# Patient Record
Sex: Male | Born: 1935 | Race: White | Hispanic: No | Smoking: Never smoker
Health system: Southern US, Community
[De-identification: ages and names within clinical notes are randomized; demographics above are authoritative.]

## PROBLEM LIST (undated history)

## (undated) DIAGNOSIS — I2699 Other pulmonary embolism without acute cor pulmonale: Secondary | ICD-10-CM

## (undated) DIAGNOSIS — I10 Essential (primary) hypertension: Secondary | ICD-10-CM

## (undated) DIAGNOSIS — E669 Obesity, unspecified: Secondary | ICD-10-CM

## (undated) DIAGNOSIS — I82409 Acute embolism and thrombosis of unspecified deep veins of unspecified lower extremity: Secondary | ICD-10-CM

## (undated) DIAGNOSIS — E785 Hyperlipidemia, unspecified: Secondary | ICD-10-CM

---

## 2004-12-08 ENCOUNTER — Ambulatory Visit: Payer: Self-pay | Admitting: General Surgery

## 2005-02-13 ENCOUNTER — Ambulatory Visit: Payer: Self-pay | Admitting: Internal Medicine

## 2005-11-09 ENCOUNTER — Ambulatory Visit: Payer: Self-pay | Admitting: Internal Medicine

## 2006-10-18 ENCOUNTER — Ambulatory Visit (HOSPITAL_BASED_OUTPATIENT_CLINIC_OR_DEPARTMENT_OTHER): Admission: RE | Admit: 2006-10-18 | Discharge: 2006-10-18 | Payer: Self-pay | Admitting: Urology

## 2009-07-10 ENCOUNTER — Emergency Department (HOSPITAL_COMMUNITY): Admission: EM | Admit: 2009-07-10 | Discharge: 2009-07-10 | Payer: Self-pay | Admitting: Emergency Medicine

## 2011-01-16 NOTE — Op Note (Signed)
Tommy Barton, Tommy Barton               ACCOUNT NO.:  [id number]   MEDICAL RECORD NO.:  [id number]          PATIENT TYPE:  AMB   LOCATION:  [facility]                         FACILITY:  [facility]   PHYSICIAN:  Valetta Fuller, M.D.  DATE OF BIRTH:  [birth year]   DATE OF PROCEDURE:  10/18/2006  DATE OF DISCHARGE:                               OPERATIVE REPORT   PREOPERATIVE DIAGNOSIS:  Left hydrocele.   POSTOPERATIVE DIAGNOSIS:  Left hydrocele.   PROCEDURE PERFORMED:  Scrotal exploration with repair of left hydrocele.   SURGEON:  Valetta Fuller, M.D.   ANESTHESIA:  General.   INDICATIONS:  [name] is a 77 year old male who was self-referred.  He  came in complaining of some left testicular pain and scrotal swelling.  Clinical exam showed a moderate to large tense left hydrocele.  This was  confirmed on ultrasound which showed a large multiseptated left  hydrocele with a normal underlying testis.  We gave the patient an  option of just continuing to observe this, versus going ahead with  surgical repair.  He opted to have surgical treatment of this; and  appeared to understand the advantages, disadvantages, and the  complications.  Full informed consent was obtained.   TECHNIQUE AND FINDINGS:  The patient was brought to the operating room  where he had successful induction of general anesthesia.  He was placed  in the supine position and prepped and draped in the usual manner.  A  median raphe incision was made; and the left hemiscrotal compartment was  entered.  A very large tense hydrocele was noted going all the way up  the cord.  Spermatic cord and tunica vaginalis were dissected free from  the scrotal wall with blunt dissection and the entire hydrocele was then  freed.  The incision was made in the hydrocele; and several hundred  milliliters of straw-colored clear fluid was obtained; again, consistent  with benign hydrocele fluid.  A small portion of redundant sac was  excised.   The  hydrocele sac was then everted in a bottleneck manner and we  utilized some 3-0 and 4-0 Vicryl suture for this.  A Marcaine spermatic  cord block was performed.  The testis was then returned to the  hemiscrotum taking great care not to have twisted the spermatic cord.  The scrotal wall was closed with two layers of Vicryl suture.  A  Tegaderm dressing was applied over the incision.  No obvious  complications occurred; and the patient appeared to do well.           ______________________________  Valetta Fuller, M.D.  Electronically Signed     DSG/MEDQ  D:  10/18/2006  T:  10/18/2006  Job:  [id number]

## 2011-12-31 ENCOUNTER — Ambulatory Visit: Payer: Self-pay | Admitting: Internal Medicine

## 2012-01-17 ENCOUNTER — Encounter (HOSPITAL_COMMUNITY): Payer: Self-pay

## 2012-01-17 ENCOUNTER — Emergency Department (HOSPITAL_COMMUNITY)
Admission: EM | Admit: 2012-01-17 | Discharge: 2012-01-17 | Disposition: A | Discharge status: Home / Self Care | Payer: Medicare Other | Attending: Emergency Medicine | Admitting: Emergency Medicine

## 2012-01-17 DIAGNOSIS — S61409A Unspecified open wound of unspecified hand, initial encounter: Secondary | ICD-10-CM | POA: Insufficient documentation

## 2012-01-17 DIAGNOSIS — Y92009 Unspecified place in unspecified non-institutional (private) residence as the place of occurrence of the external cause: Secondary | ICD-10-CM | POA: Insufficient documentation

## 2012-01-17 DIAGNOSIS — T148XXA Other injury of unspecified body region, initial encounter: Secondary | ICD-10-CM

## 2012-01-17 DIAGNOSIS — W540XXA Bitten by dog, initial encounter: Secondary | ICD-10-CM | POA: Insufficient documentation

## 2012-01-17 MED ORDER — AMOXICILLIN-POT CLAVULANATE 875-125 MG PO TABS: 1.0000 | ORAL_TABLET | Freq: Two times a day (BID) | ORAL | Status: AC

## 2012-01-17 MED ORDER — DIPHTH-ACELL PERTUSSIS-TETANUS 25-58-10 LF-MCG/0.5 IM SUSP
0.5000 mL | Freq: Once | INTRAMUSCULAR | Status: AC
  Administered 2012-01-17: 0.5 mL via INTRAMUSCULAR
  Filled 2012-01-17 (×2): qty 0.5

## 2012-01-17 MED ORDER — HYDROCODONE-ACETAMINOPHEN 7.5-325 MG PO TABS: 1.0000 | ORAL_TABLET | ORAL | Status: AC | PRN

## 2012-01-17 MED ORDER — ONDANSETRON HCL 4 MG PO TABS
4.0000 mg | ORAL_TABLET | Freq: Once | ORAL | Status: AC
  Administered 2012-01-17: 4 mg via ORAL
  Filled 2012-01-17: qty 1

## 2012-01-17 MED ORDER — AMOXICILLIN-POT CLAVULANATE 875-125 MG PO TABS
1.0000 | ORAL_TABLET | Freq: Once | ORAL | Status: AC
  Administered 2012-01-17: 1 via ORAL
  Filled 2012-01-17: qty 1

## 2012-01-17 NOTE — Discharge Instructions (Signed)
Animal Bite PLEASE CLEANSE THE WOUND WITH SOAP AND WATER DAILY. PLEASE HAVE THE WOUND RECHECKED ON WED. MAY 22. AUGMENTIN 2 TIMES DAILY AFTER A MEAL. TYLENOL FOR MILD PAIN. NORCO FOR FOR SEVERE PAIN. THIS MAY CAUSE DROWSINESS,AND CONSTIPATION, USE WITH CAUTION.                             An animal bite can result in a scratch on the skin, deep open cut, puncture of the skin, crush injury, or tearing away of the skin or a body part. Dogs are responsible for most animal bites. Children are bitten more often than adults. An animal bite can range from very mild to more serious. A small bite from your house pet is no cause for alarm. However, some animal bites can become infected or injure a bone or other tissue. You must seek medical care if:  The skin is broken and bleeding does not slow down or stop after 15 minutes.   The puncture is deep and difficult to clean (such as a cat bite).   Pain, warmth, redness, or pus develops around the wound.   The bite is from a stray animal or rodent. There may be a risk of rabies infection.   The bite is from a snake, raccoon, skunk, fox, coyote, or bat. There may be a risk of rabies infection.   The person bitten has a chronic illness such as diabetes, liver disease, or cancer, or the person takes medicine that lowers the immune system.   There is concern about the location and severity of the bite.  It is important to clean and protect an animal bite wound right away to prevent infection. Follow these steps:  Clean the wound with plenty of water and soap.   Apply an antibiotic cream.   Apply gentle pressure over the wound with a clean towel or gauze to slow or stop bleeding.   Elevate the affected area above the heart to help stop any bleeding.   Seek medical care. Getting medical care within 8 hours of the animal bite leads to the best possible outcome.  DIAGNOSIS  Your caregiver will most likely:  Take a detailed history of the animal and the bite  injury.   Perform a wound exam.   Take your medical history.  Blood tests or X-rays may be performed. Sometimes, infected bite wounds are cultured and sent to a lab to identify the infectious bacteria.  TREATMENT  Medical treatment will depend on the location and type of animal bite as well as the patient's medical history. Treatment may include:  Wound care, such as cleaning and flushing the wound with saline solution, bandaging, and elevating the affected area.   Antibiotics.   Tetanus immunization.   Rabies immunization.   Leaving the wound open to heal. This is often done with animal bites, due to the high risk of infection. However, in certain cases, wound closure with stitches, wound adhesive, skin adhesive strips, or staples may be used.  Infected bites that are left untreated may require intravenous (IV) antibiotics and surgical treatment in the hospital. HOME CARE INSTRUCTIONS  Follow your caregiver's instructions for wound care.   Take all medicines as directed.   If your caregiver prescribes antibiotics, take them as directed. Finish them even if you start to feel better.   Follow up with your caregiver for further exams or immunizations as directed.  You may need a tetanus shot  if:  You cannot remember when you had your last tetanus shot.   You have never had a tetanus shot.   The injury broke your skin.  If you get a tetanus shot, your arm may swell, get red, and feel warm to the touch. This is common and not a problem. If you need a tetanus shot and you choose not to have one, there is a rare chance of getting tetanus. Sickness from tetanus can be serious. SEEK MEDICAL CARE IF:  You notice warmth, redness, soreness, swelling, pus discharge, or a bad smell coming from the wound.   You have a red line on the skin coming from the wound.   You have a fever, chills, or a general ill feeling.   You have nausea or vomiting.   You have continued or worsening pain.    You have trouble moving the injured part.   You have other questions or concerns.  MAKE SURE YOU:  Understand these instructions.   Will watch your condition.   Will get help right away if you are not doing well or get worse.  Document Released: 05/05/2011 Document Revised: 08/06/2011 Document Reviewed: 05/05/2011 [facility] Patient Information 2012 [place], [place].

## 2012-01-17 NOTE — ED Provider Notes (Signed)
History     CSN: [id number]  Arrival date & time 01/17/12  1408   First MD Initiated Contact with Patient 01/17/12 1556      Chief Complaint  Patient presents with  . Animal Bite    (Consider location/radiation/quality/duration/timing/severity/associated sxs/prior treatment) Patient is a 78 y.o. male presenting with animal bite. The history is provided by the patient.  Animal Bite  The incident occurred yesterday. The incident occurred at another residence. Head/neck injury location: none. There is an injury to the left upper arm. The pain is moderate. It is unlikely that a foreign body is present. Pertinent negatives include no chest pain, no abdominal pain, no headaches, no inability to bear weight, no neck pain, no pain when bearing weight, no focal weakness, no light-headedness, no loss of consciousness, no seizures, no cough and no difficulty breathing. There have been no prior injuries to these areas. He is right-handed. His tetanus status is out of date. He has been behaving normally. There were no sick contacts. He has received no recent medical care.    History reviewed. No pertinent past medical history.  History reviewed. No pertinent past surgical history.  No family history on file.  History  Substance Use Topics  . Smoking status: Never Smoker   . Smokeless tobacco: Not on file  . Alcohol Use: No      Review of Systems  Constitutional: Negative for activity change.       All ROS Neg except as noted in HPI  HENT: Negative for nosebleeds and neck pain.   Eyes: Negative for photophobia and discharge.  Respiratory: Negative for cough, shortness of breath and wheezing.   Cardiovascular: Negative for chest pain and palpitations.  Gastrointestinal: Negative for abdominal pain and blood in stool.  Genitourinary: Negative for dysuria, frequency and hematuria.  Musculoskeletal: Negative for back pain and arthralgias.  Skin: Negative.   Neurological: Negative for  dizziness, focal weakness, seizures, loss of consciousness, speech difficulty, light-headedness and headaches.  Psychiatric/Behavioral: Negative for hallucinations and confusion.    Allergies  Review of patient's allergies indicates no known allergies.  Home Medications  No current outpatient prescriptions on file.  BP 137/66  Pulse 55  Temp(Src) 97.5 F (36.4 C) (Oral)  Resp 20  Ht 5\' 10"  (1.778 m)  Wt 270 lb (122.471 kg)  BMI 38.74 kg/m2  SpO2 100%  Physical Exam  Nursing note and vitals reviewed. Constitutional: He is oriented to person, place, and time. He appears well-developed and well-nourished.  Non-toxic appearance.  HENT:  Head: Normocephalic.  Right Ear: Tympanic membrane and external ear normal.  Left Ear: Tympanic membrane and external ear normal.  Eyes: EOM and lids are normal. Pupils are equal, round, and reactive to light.  Neck: Normal range of motion. Neck supple. Carotid bruit is not present.  Cardiovascular: Normal rate, regular rhythm, normal heart sounds, intact distal pulses and normal pulses.   Pulmonary/Chest: Breath sounds normal. No respiratory distress.  Abdominal: Soft. Bowel sounds are normal. There is no tenderness. There is no guarding.  Musculoskeletal: Normal range of motion.       FROM of the fingers and wrist of the left hand. 2 puncture wounds to the tricep area. Large bruise to the same area. Soreness of the left shoulder. No dislocation. PUlses symmetrical.   Lymphadenopathy:       Head (right side): No submandibular adenopathy present.       Head (left side): No submandibular adenopathy present.    He has no  cervical adenopathy.  Neurological: He is alert and oriented to person, place, and time. He has normal strength. No cranial nerve deficit or sensory deficit.  Skin: Skin is warm and dry.  Psychiatric: He has a normal mood and affect. His speech is normal.    ED Course  Procedures (including critical care time)  Labs Reviewed -  No data to display No results found. Pulse ox 100% on RA. WNL by my interpretation.  No diagnosis found.    MDM  I have reviewed nursing notes, vital signs, and all appropriate lab and imaging results for this patient. Pt sustained a bite by a large dog on 5/18. He presents today with 2 puncture wounds and a large bruise. Good ROM of all joints. Pt treated with augmentin. Tetanus given. Rx for norco 7.5 #20 given. Pt to follow up with his primary MD or return to the ED in 2 days.       Kathie Dike, [place] 01/17/12 [phone]

## 2012-01-17 NOTE — ED Provider Notes (Signed)
Medical screening examination/treatment/procedure(s) were performed by non-physician practitioner and as supervising physician I was immediately available for consultation/collaboration.  Per patient, animal is UTD on rabies and animal control has been contacted  Joya Gaskins, MD 01/17/12 [phone]

## 2012-01-17 NOTE — ED Notes (Signed)
Pt reports delivered some hay to someone and their dog bit pt's left arm yesterday.   Nursing secretary calling [place]  animal control.  Pt reports last tetanus shot was greater than 5 years ago.  Bruising noted to left posterior upper arm.  2 puncture marks also noted to arm, bleeding controlled with bandaid.

## 2012-01-17 NOTE — ED Notes (Signed)
Pt states was delivering hay to a neighbor when their dog lunged at him biting him in the upper left arm. Bruising, swelling , redness and 3 puncture wounds noted. Bleeding controlled. Wound cleaned. Pt tolerated well.

## 2012-08-09 ENCOUNTER — Emergency Department (HOSPITAL_COMMUNITY)
Admission: EM | Admit: 2012-08-09 | Discharge: 2012-08-09 | Disposition: A | Discharge status: Home / Self Care | Payer: Medicare Other | Attending: Emergency Medicine | Admitting: Emergency Medicine

## 2012-08-09 ENCOUNTER — Emergency Department (HOSPITAL_COMMUNITY): Payer: Medicare Other

## 2012-08-09 ENCOUNTER — Encounter (HOSPITAL_COMMUNITY): Payer: Self-pay | Admitting: Emergency Medicine

## 2012-08-09 DIAGNOSIS — Y9301 Activity, walking, marching and hiking: Secondary | ICD-10-CM | POA: Insufficient documentation

## 2012-08-09 DIAGNOSIS — I10 Essential (primary) hypertension: Secondary | ICD-10-CM | POA: Insufficient documentation

## 2012-08-09 DIAGNOSIS — W010XXA Fall on same level from slipping, tripping and stumbling without subsequent striking against object, initial encounter: Secondary | ICD-10-CM | POA: Insufficient documentation

## 2012-08-09 DIAGNOSIS — W19XXXA Unspecified fall, initial encounter: Secondary | ICD-10-CM

## 2012-08-09 DIAGNOSIS — S7010XA Contusion of unspecified thigh, initial encounter: Secondary | ICD-10-CM | POA: Insufficient documentation

## 2012-08-09 DIAGNOSIS — Y9289 Other specified places as the place of occurrence of the external cause: Secondary | ICD-10-CM | POA: Insufficient documentation

## 2012-08-09 DIAGNOSIS — S7012XA Contusion of left thigh, initial encounter: Secondary | ICD-10-CM

## 2012-08-09 HISTORY — DX: Essential (primary) hypertension: I10

## 2012-08-09 NOTE — ED Provider Notes (Signed)
History     CSN: [id number]  Arrival date & time 08/09/12  1125   First MD Initiated Contact with Patient 08/09/12 1302      Chief Complaint  Patient presents with  . Fall  . Hip Pain    (Consider location/radiation/quality/duration/timing/severity/associated sxs/prior treatment) HPI Sava Olden is a 78 y.o. male fell yesterday on rain soaked clay and fell on left leg -hip doesn't really hurt. Pt has been walking on it and says pain is moderate to severe, feels like a cramp in the back of his leg, persisted all night constantly and has been making him limp. Patient did not hit his head and did not lose consciousness on fall. Patient did not have any dizziness or chest pain or shortness of breath prior to his fall-he said he was just being impatient and slipped on some clay. Aleve helping.    Past Medical History  Diagnosis Date  . Hypertension     History reviewed. No pertinent past surgical history.  History reviewed. No pertinent family history.  History  Substance Use Topics  . Smoking status: Never Smoker   . Smokeless tobacco: Not on file  . Alcohol Use: No      Review of Systems At least 10pt or greater review of systems completed and are negative except where specified in the HPI.  Allergies  Review of patient's allergies indicates no known allergies.  Home Medications  No current outpatient prescriptions on file.  BP 154/70  Pulse 68  Temp 97.2 F (36.2 C)  Resp 18  SpO2 99%  Physical Exam  Nursing notes reviewed.  Electronic medical record reviewed. VITAL SIGNS:   Filed Vitals:   08/09/12 1130  BP: 154/70  Pulse: 68  Temp: 97.2 F (36.2 C)  Resp: 18  SpO2: 99%   CONSTITUTIONAL: Awake, oriented, appears non-toxic HENT: Atraumatic, normocephalic, oral mucosa pink and moist, airway patent. Nares patent without drainage. External ears normal. EYES: Conjunctiva clear, EOMI, PERRLA NECK: Trachea midline, non-tender, supple CARDIOVASCULAR:  Normal heart rate, Normal rhythm, No murmurs, rubs, gallops PULMONARY/CHEST: Clear to auscultation, no rhonchi, wheezes, or rales. Symmetrical breath sounds. Non-tender. ABDOMINAL: Non-distended, soft, obese, non-tender - no rebound or guarding.  BS normal. NEUROLOGIC: Non-focal, moving all four extremities, no gross sensory or motor deficits. EXTREMITIES: No clubbing, cyanosis, or edema.  TTP left thigh and hamstring.  Contusion to distal left hamstring.  SKIN: Warm, Dry, No erythema, No rash. Ecchymoses to left upper arm.  ED Course  Procedures (including critical care time)  Labs Reviewed - No data to display Dg Hip Complete Left  08/09/2012  *RADIOLOGY REPORT*  Clinical Data: Left hip pain post fall yesterday  LEFT HIP - COMPLETE 2+ VIEW  Comparison: None.  Findings: Three views of the left hip submitted.  No acute fracture or subluxation.  Mild degenerative changes bilateral hip joints with mild superior narrowing of the joint space.  Mild spurring of the left greater femoral trochanter.  IMPRESSION: No acute fracture or subluxation.  Mild degenerative changes.   Original Report Authenticated By: Natasha Mead, M.D.      1. Contusion of thigh, left   2. Fall       MDM  Garland Suriano is a 78 y.o. male resenting with contusion of his hamstring suffered after a fall. No fracture seen on pelvic x-ray. I do not think he's got a fracture his femur, he has no pain in his knee I do not think further x-rays are indicated at this time.  Aleve has been working for him so he said to continue that. Also urged him to use either a warm bath or warm compresses or heating pad to help mobilize the fluid and contusion in the back of his thigh.  I explained the diagnosis and have given explicit precautions to return to the ER including any other new or worsening symptoms. The patient understands and accepts the medical plan as it's been dictated and I have answered their questions. Discharge instructions  concerning home care and prescriptions have been given.  The patient is STABLE and is discharged to home in good condition.       Jones Skene, MD 08/09/12 1549

## 2012-08-09 NOTE — ED Notes (Signed)
Pt c/o left upper leg and hip pain that began after he fell yesterday afternoon. Pt states he slipped in mud while walking outside. Pt denies hitting head. Pt has taken OTC pain meds with mild relief.

## 2012-12-07 ENCOUNTER — Ambulatory Visit: Payer: Self-pay | Admitting: Gastroenterology

## 2013-09-08 DIAGNOSIS — Z85828 Personal history of other malignant neoplasm of skin: Secondary | ICD-10-CM | POA: Diagnosis not present

## 2013-09-08 DIAGNOSIS — L570 Actinic keratosis: Secondary | ICD-10-CM | POA: Diagnosis not present

## 2014-02-28 DIAGNOSIS — N529 Male erectile dysfunction, unspecified: Secondary | ICD-10-CM | POA: Diagnosis not present

## 2014-03-08 DIAGNOSIS — L570 Actinic keratosis: Secondary | ICD-10-CM | POA: Diagnosis not present

## 2014-03-08 DIAGNOSIS — Z85828 Personal history of other malignant neoplasm of skin: Secondary | ICD-10-CM | POA: Diagnosis not present

## 2014-03-08 DIAGNOSIS — L7211 Pilar cyst: Secondary | ICD-10-CM | POA: Diagnosis not present

## 2014-03-08 DIAGNOSIS — D0420 Carcinoma in situ of skin of unspecified ear and external auricular canal: Secondary | ICD-10-CM | POA: Diagnosis not present

## 2014-03-08 DIAGNOSIS — D485 Neoplasm of uncertain behavior of skin: Secondary | ICD-10-CM | POA: Diagnosis not present

## 2014-03-22 DIAGNOSIS — I10 Essential (primary) hypertension: Secondary | ICD-10-CM | POA: Diagnosis not present

## 2014-03-22 DIAGNOSIS — E785 Hyperlipidemia, unspecified: Secondary | ICD-10-CM | POA: Diagnosis not present

## 2014-03-22 DIAGNOSIS — D0420 Carcinoma in situ of skin of unspecified ear and external auricular canal: Secondary | ICD-10-CM | POA: Diagnosis not present

## 2014-03-22 DIAGNOSIS — D0439 Carcinoma in situ of skin of other parts of face: Secondary | ICD-10-CM | POA: Diagnosis not present

## 2014-03-22 DIAGNOSIS — D0430 Carcinoma in situ of skin of unspecified part of face: Secondary | ICD-10-CM | POA: Diagnosis not present

## 2014-03-23 DIAGNOSIS — E039 Hypothyroidism, unspecified: Secondary | ICD-10-CM | POA: Diagnosis not present

## 2014-03-23 DIAGNOSIS — E785 Hyperlipidemia, unspecified: Secondary | ICD-10-CM | POA: Diagnosis not present

## 2014-03-23 DIAGNOSIS — I10 Essential (primary) hypertension: Secondary | ICD-10-CM | POA: Diagnosis not present

## 2014-03-28 DIAGNOSIS — D509 Iron deficiency anemia, unspecified: Secondary | ICD-10-CM | POA: Diagnosis not present

## 2014-03-28 DIAGNOSIS — I10 Essential (primary) hypertension: Secondary | ICD-10-CM | POA: Diagnosis not present

## 2014-03-28 DIAGNOSIS — E785 Hyperlipidemia, unspecified: Secondary | ICD-10-CM | POA: Diagnosis not present

## 2014-09-07 DIAGNOSIS — L820 Inflamed seborrheic keratosis: Secondary | ICD-10-CM | POA: Diagnosis not present

## 2014-09-07 DIAGNOSIS — L570 Actinic keratosis: Secondary | ICD-10-CM | POA: Diagnosis not present

## 2014-09-07 DIAGNOSIS — D485 Neoplasm of uncertain behavior of skin: Secondary | ICD-10-CM | POA: Diagnosis not present

## 2014-09-07 DIAGNOSIS — L821 Other seborrheic keratosis: Secondary | ICD-10-CM | POA: Diagnosis not present

## 2014-09-07 DIAGNOSIS — X32XXXA Exposure to sunlight, initial encounter: Secondary | ICD-10-CM | POA: Diagnosis not present

## 2014-09-07 DIAGNOSIS — Z85828 Personal history of other malignant neoplasm of skin: Secondary | ICD-10-CM | POA: Diagnosis not present

## 2014-09-24 DIAGNOSIS — R0789 Other chest pain: Secondary | ICD-10-CM | POA: Diagnosis not present

## 2014-09-24 DIAGNOSIS — M549 Dorsalgia, unspecified: Secondary | ICD-10-CM | POA: Diagnosis not present

## 2014-09-24 DIAGNOSIS — R109 Unspecified abdominal pain: Secondary | ICD-10-CM | POA: Diagnosis not present

## 2015-03-08 DIAGNOSIS — X32XXXA Exposure to sunlight, initial encounter: Secondary | ICD-10-CM | POA: Diagnosis not present

## 2015-03-08 DIAGNOSIS — L821 Other seborrheic keratosis: Secondary | ICD-10-CM | POA: Diagnosis not present

## 2015-03-08 DIAGNOSIS — L570 Actinic keratosis: Secondary | ICD-10-CM | POA: Diagnosis not present

## 2015-03-08 DIAGNOSIS — Z85828 Personal history of other malignant neoplasm of skin: Secondary | ICD-10-CM | POA: Diagnosis not present

## 2015-05-24 ENCOUNTER — Ambulatory Visit
Admission: RE | Admit: 2015-05-24 | Discharge: 2015-05-24 | Disposition: A | Discharge status: Home / Self Care | Payer: Medicare Other | Source: Ambulatory Visit | Attending: Internal Medicine | Admitting: Internal Medicine

## 2015-05-24 ENCOUNTER — Other Ambulatory Visit: Payer: Self-pay | Admitting: Internal Medicine

## 2015-05-24 ENCOUNTER — Ambulatory Visit
Admission: RE | Admit: 2015-05-24 | Discharge: 2015-05-24 | Disposition: A | Discharge status: Home / Self Care | Payer: Medicare Other | Source: Ambulatory Visit | Attending: Cardiology | Admitting: Cardiology

## 2015-05-24 DIAGNOSIS — M542 Cervicalgia: Secondary | ICD-10-CM

## 2015-05-24 DIAGNOSIS — M5032 Other cervical disc degeneration, mid-cervical region: Secondary | ICD-10-CM | POA: Diagnosis not present

## 2015-05-24 DIAGNOSIS — M5412 Radiculopathy, cervical region: Secondary | ICD-10-CM | POA: Diagnosis not present

## 2015-05-24 DIAGNOSIS — M25512 Pain in left shoulder: Secondary | ICD-10-CM | POA: Diagnosis not present

## 2015-05-24 DIAGNOSIS — M4692 Unspecified inflammatory spondylopathy, cervical region: Secondary | ICD-10-CM | POA: Diagnosis not present

## 2015-05-24 DIAGNOSIS — M539 Dorsopathy, unspecified: Secondary | ICD-10-CM | POA: Diagnosis not present

## 2015-05-24 DIAGNOSIS — M79602 Pain in left arm: Secondary | ICD-10-CM | POA: Diagnosis not present

## 2015-05-28 DIAGNOSIS — Z23 Encounter for immunization: Secondary | ICD-10-CM | POA: Diagnosis not present

## 2015-05-31 DIAGNOSIS — I10 Essential (primary) hypertension: Secondary | ICD-10-CM | POA: Diagnosis not present

## 2015-05-31 DIAGNOSIS — R5381 Other malaise: Secondary | ICD-10-CM | POA: Diagnosis not present

## 2015-05-31 DIAGNOSIS — E784 Other hyperlipidemia: Secondary | ICD-10-CM | POA: Diagnosis not present

## 2015-05-31 DIAGNOSIS — Z125 Encounter for screening for malignant neoplasm of prostate: Secondary | ICD-10-CM | POA: Diagnosis not present

## 2015-12-06 DIAGNOSIS — X32XXXA Exposure to sunlight, initial encounter: Secondary | ICD-10-CM | POA: Diagnosis not present

## 2015-12-06 DIAGNOSIS — D0439 Carcinoma in situ of skin of other parts of face: Secondary | ICD-10-CM | POA: Diagnosis not present

## 2015-12-06 DIAGNOSIS — Z85828 Personal history of other malignant neoplasm of skin: Secondary | ICD-10-CM | POA: Diagnosis not present

## 2015-12-06 DIAGNOSIS — Z08 Encounter for follow-up examination after completed treatment for malignant neoplasm: Secondary | ICD-10-CM | POA: Diagnosis not present

## 2015-12-06 DIAGNOSIS — D485 Neoplasm of uncertain behavior of skin: Secondary | ICD-10-CM | POA: Diagnosis not present

## 2015-12-06 DIAGNOSIS — L570 Actinic keratosis: Secondary | ICD-10-CM | POA: Diagnosis not present

## 2016-03-04 DIAGNOSIS — D0439 Carcinoma in situ of skin of other parts of face: Secondary | ICD-10-CM | POA: Diagnosis not present

## 2016-03-04 DIAGNOSIS — Z08 Encounter for follow-up examination after completed treatment for malignant neoplasm: Secondary | ICD-10-CM | POA: Diagnosis not present

## 2016-03-04 DIAGNOSIS — Z85828 Personal history of other malignant neoplasm of skin: Secondary | ICD-10-CM | POA: Diagnosis not present

## 2016-09-10 DIAGNOSIS — Z08 Encounter for follow-up examination after completed treatment for malignant neoplasm: Secondary | ICD-10-CM | POA: Diagnosis not present

## 2016-09-10 DIAGNOSIS — E784 Other hyperlipidemia: Secondary | ICD-10-CM | POA: Diagnosis not present

## 2016-09-10 DIAGNOSIS — Z85828 Personal history of other malignant neoplasm of skin: Secondary | ICD-10-CM | POA: Diagnosis not present

## 2016-09-10 DIAGNOSIS — L820 Inflamed seborrheic keratosis: Secondary | ICD-10-CM | POA: Diagnosis not present

## 2016-09-10 DIAGNOSIS — L821 Other seborrheic keratosis: Secondary | ICD-10-CM | POA: Diagnosis not present

## 2016-09-10 DIAGNOSIS — Z125 Encounter for screening for malignant neoplasm of prostate: Secondary | ICD-10-CM | POA: Diagnosis not present

## 2016-09-10 DIAGNOSIS — E669 Obesity, unspecified: Secondary | ICD-10-CM | POA: Diagnosis not present

## 2016-09-10 DIAGNOSIS — L538 Other specified erythematous conditions: Secondary | ICD-10-CM | POA: Diagnosis not present

## 2016-09-10 DIAGNOSIS — E785 Hyperlipidemia, unspecified: Secondary | ICD-10-CM | POA: Diagnosis not present

## 2016-09-10 DIAGNOSIS — R5381 Other malaise: Secondary | ICD-10-CM | POA: Diagnosis not present

## 2016-09-10 DIAGNOSIS — I10 Essential (primary) hypertension: Secondary | ICD-10-CM | POA: Diagnosis not present

## 2016-09-18 DIAGNOSIS — E785 Hyperlipidemia, unspecified: Secondary | ICD-10-CM | POA: Diagnosis not present

## 2016-09-18 DIAGNOSIS — E669 Obesity, unspecified: Secondary | ICD-10-CM | POA: Diagnosis not present

## 2016-09-18 DIAGNOSIS — I10 Essential (primary) hypertension: Secondary | ICD-10-CM | POA: Diagnosis not present

## 2016-09-18 DIAGNOSIS — E784 Other hyperlipidemia: Secondary | ICD-10-CM | POA: Diagnosis not present

## 2016-12-26 ENCOUNTER — Emergency Department (HOSPITAL_COMMUNITY): Payer: Medicare Other

## 2016-12-26 ENCOUNTER — Encounter (HOSPITAL_COMMUNITY): Payer: Self-pay

## 2016-12-26 ENCOUNTER — Emergency Department (HOSPITAL_COMMUNITY)
Admission: EM | Admit: 2016-12-26 | Discharge: 2016-12-26 | Disposition: A | Discharge status: Home / Self Care | Payer: Medicare Other | Attending: Emergency Medicine | Admitting: Emergency Medicine

## 2016-12-26 DIAGNOSIS — M1711 Unilateral primary osteoarthritis, right knee: Secondary | ICD-10-CM

## 2016-12-26 DIAGNOSIS — I10 Essential (primary) hypertension: Secondary | ICD-10-CM | POA: Insufficient documentation

## 2016-12-26 DIAGNOSIS — M25561 Pain in right knee: Secondary | ICD-10-CM | POA: Diagnosis not present

## 2016-12-26 MED ORDER — DICLOFENAC SODIUM 75 MG PO TBEC: 75.0000 mg | DELAYED_RELEASE_TABLET | Freq: Two times a day (BID) | ORAL | 0 refills | Status: DC | PRN

## 2016-12-26 NOTE — ED Triage Notes (Signed)
Pt reports r knee pain x 1 week.  Denies injury.  Says pain has gotten progressively worse.

## 2016-12-26 NOTE — ED Notes (Signed)
From radiology 

## 2016-12-26 NOTE — ED Provider Notes (Signed)
[facility] DEPT Provider Note   CSN: [id number] Arrival date & time: 12/26/16  1016     History   Chief Complaint Chief Complaint  Patient presents with  . Knee Pain    HPI Tommy Barton is a 83 y.o. male with no significant past medical history an no history of trauma presenting with right knee pain which started one week ago which is new pain, denies any prior problems with the knee.  He has applied a generic muscle rub cream with no significant improvement, took 2 pain tablets this am (not sure if aleve or ibuprofen) with some relief.  His pain is worsened with weight bearing but is constant, keeping him awake last night.  He states his pain is getting worse and he has noticed a popping sensation along his medial anterior knee with certain movements.  Pain radiates into the posterior medial knee and to his right calf.  The history is provided by the patient.    Past Medical History:  Diagnosis Date  . Hypertension     There are no active problems to display for this patient.   History reviewed. No pertinent surgical history.     Home Medications    Prior to Admission medications   Medication Sig Start Date End Date Taking? Authorizing Provider  atenolol (TENORMIN) 50 MG tablet Take 1 tablet by mouth daily. 12/13/16  Yes Historical Provider, MD  enalapril (VASOTEC) 20 MG tablet Take 1 tablet by mouth daily. 12/13/16  Yes Historical Provider, MD  hydrochlorothiazide (HYDRODIURIL) 25 MG tablet Take 1 tablet by mouth daily. 12/13/16  Yes Historical Provider, MD  lovastatin (MEVACOR) 40 MG tablet Take 1 tablet by mouth daily. 12/13/16  Yes Historical Provider, MD  diclofenac (VOLTAREN) 75 MG EC tablet Take 1 tablet (75 mg total) by mouth 2 (two) times daily as needed. 12/26/16   Evalee Jefferson, PA-C    Family History No family history on file.  Social History Social History  Substance Use Topics  . Smoking status: Never Smoker  . Smokeless tobacco: Never Used  .  Alcohol use No     Allergies   Patient has no known allergies.   Review of Systems Review of Systems  Constitutional: Negative for fever.  Musculoskeletal: Positive for arthralgias. Negative for joint swelling and myalgias.  Skin: Negative for color change.  Neurological: Negative for weakness and numbness.     Physical Exam Updated Vital Signs BP (!) 117/55 (BP Location: Left Arm)   Pulse 64   Temp 97.9 F (36.6 C) (Oral)   Resp 20   Ht 5\' 9"  (1.753 m)   Wt 127 kg   SpO2 95%   BMI 41.35 kg/m   Physical Exam  Constitutional: He appears well-developed and well-nourished.  HENT:  Head: Atraumatic.  Neck: Normal range of motion.  Cardiovascular:  Pulses:      Dorsalis pedis pulses are 2+ on the right side, and 2+ on the left side.  Pulses equal bilaterally.  Musculoskeletal: He exhibits tenderness.       Right knee: He exhibits bony tenderness. He exhibits normal range of motion, no swelling, no effusion, no erythema, no LCL laxity and no MCL laxity. Tenderness found. Medial joint line tenderness noted.  ttp right anterior and medial right knee joint.  No patellar tenderness.  No edema or erythema, no evidence bakers cyst when standing.  Calf is soft and nontender to palpation.   Neurological: He is alert. He has normal strength. He displays normal  reflexes. No sensory deficit.  Skin: Skin is warm and dry.  Psychiatric: He has a normal mood and affect.     ED Treatments / Results  Labs (all labs ordered are listed, but only abnormal results are displayed) Labs Reviewed - No data to display  EKG  EKG Interpretation None       Radiology Dg Knee Complete 4 Views Right  Result Date: 12/26/2016 CLINICAL DATA:  Right knee pain for 1 week.  No known injury. EXAM: RIGHT KNEE - COMPLETE 4+ VIEW COMPARISON:  None. FINDINGS: No evidence of fracture, dislocation, or joint effusion. Mild medial femorotibial compartment joint space narrowing. Soft tissues are  unremarkable. IMPRESSION: No acute osseous injury of the right knee. Electronically Signed   By: Kathreen Devoid   On: 12/26/2016 11:18    Procedures Procedures (including critical care time)  Medications Ordered in ED Medications - No data to display   Initial Impression / Assessment and Plan / ED Course  I have reviewed the triage vital signs and the nursing notes.  Pertinent labs & imaging results that were available during my care of the patient were reviewed by me and considered in my medical decision making (see chart for details).     Pt with medial right knee osteoarthitis. Diclofenac, heat tx,  Referral to ortho prn.   Pt seen by Dr. Gilford Raid prior to dc home.   The patient appears reasonably screened and/or stabilized for discharge and I doubt any other medical condition or other [facility] requiring further screening, evaluation, or treatment in the ED at this time prior to discharge.   Final Clinical Impressions(s) / ED Diagnoses   Final diagnoses:  Primary osteoarthritis of right knee    New Prescriptions New Prescriptions   DICLOFENAC (VOLTAREN) 75 MG EC TABLET    Take 1 tablet (75 mg total) by mouth 2 (two) times daily as needed.      Evalee Jefferson, PA-C 12/26/16 [place], MD 12/26/16 1356

## 2016-12-29 DIAGNOSIS — S8981XA Other specified injuries of right lower leg, initial encounter: Secondary | ICD-10-CM | POA: Diagnosis not present

## 2016-12-29 DIAGNOSIS — M1711 Unilateral primary osteoarthritis, right knee: Secondary | ICD-10-CM | POA: Diagnosis not present

## 2016-12-30 ENCOUNTER — Other Ambulatory Visit: Payer: Self-pay | Admitting: Orthopedic Surgery

## 2016-12-30 DIAGNOSIS — M25561 Pain in right knee: Secondary | ICD-10-CM

## 2017-01-13 ENCOUNTER — Ambulatory Visit
Admission: RE | Admit: 2017-01-13 | Discharge: 2017-01-13 | Disposition: A | Discharge status: Home / Self Care | Payer: Medicare Other | Source: Ambulatory Visit | Attending: Orthopedic Surgery | Admitting: Orthopedic Surgery

## 2017-01-13 DIAGNOSIS — M25561 Pain in right knee: Secondary | ICD-10-CM

## 2017-01-19 DIAGNOSIS — M1711 Unilateral primary osteoarthritis, right knee: Secondary | ICD-10-CM | POA: Diagnosis not present

## 2017-01-19 DIAGNOSIS — S83241A Other tear of medial meniscus, current injury, right knee, initial encounter: Secondary | ICD-10-CM | POA: Diagnosis not present

## 2017-03-24 DIAGNOSIS — S83231A Complex tear of medial meniscus, current injury, right knee, initial encounter: Secondary | ICD-10-CM | POA: Diagnosis not present

## 2017-03-24 DIAGNOSIS — M1711 Unilateral primary osteoarthritis, right knee: Secondary | ICD-10-CM | POA: Diagnosis not present

## 2017-03-24 DIAGNOSIS — M6751 Plica syndrome, right knee: Secondary | ICD-10-CM | POA: Diagnosis not present

## 2017-03-24 DIAGNOSIS — M659 Synovitis and tenosynovitis, unspecified: Secondary | ICD-10-CM | POA: Diagnosis not present

## 2017-03-24 DIAGNOSIS — Y999 Unspecified external cause status: Secondary | ICD-10-CM | POA: Diagnosis not present

## 2017-03-24 DIAGNOSIS — G8918 Other acute postprocedural pain: Secondary | ICD-10-CM | POA: Diagnosis not present

## 2017-03-24 DIAGNOSIS — M94261 Chondromalacia, right knee: Secondary | ICD-10-CM | POA: Diagnosis not present

## 2017-03-31 ENCOUNTER — Ambulatory Visit (HOSPITAL_COMMUNITY): Payer: Medicare Other | Attending: Orthopedic Surgery

## 2017-03-31 ENCOUNTER — Encounter (HOSPITAL_COMMUNITY): Payer: Self-pay

## 2017-03-31 DIAGNOSIS — M6281 Muscle weakness (generalized): Secondary | ICD-10-CM | POA: Diagnosis not present

## 2017-03-31 DIAGNOSIS — R29898 Other symptoms and signs involving the musculoskeletal system: Secondary | ICD-10-CM

## 2017-03-31 DIAGNOSIS — M25661 Stiffness of right knee, not elsewhere classified: Secondary | ICD-10-CM

## 2017-03-31 DIAGNOSIS — M25561 Pain in right knee: Secondary | ICD-10-CM | POA: Diagnosis not present

## 2017-03-31 NOTE — Patient Instructions (Signed)
  HEEL SLIDES - LONG SIT WITH TOWEL AND BELT  While in a sitting position, place a small hand towel under your heel. Next, loop a belt, towel or bed sheet around your foot and pull your knee into a bend position as your foot slides towards your buttock. Hold a gentle stretch and then return back to original position.  Perform 1x/day, 2-3 sets of 10 reps holding for 2-3 seconds at the top   HAMSTRING STRETCH WITH TOWEL  While lying down on your back, hook a towel or strap under  your foot and draw up your leg until a stretch is felt under your leg. calf area.  Keep your knee in a straightened position during the stretch.  Perform 1x/day, 3-5 stretches holding for 30-60 seconds

## 2017-03-31 NOTE — Therapy (Addendum)
[place], [place], [address] Phone: [phone]   Fax:  [phone]  Physical Therapy Evaluation  Patient Details  Name: Tommy Barton MRN: [id number] Date of Birth: 1932/02/02 Referring Provider: Vickey Huger, MD  Encounter Date: 03/31/2017    Past Medical History:  Diagnosis Date  . Hypertension     History reviewed. No pertinent surgical history.  There were no vitals filed for this visit.       Subjective Assessment - 03/31/17 0950    Subjective Pt states that his R knee has been hurting him since April 2018. He states that it came on insidiously, without trauma. He had arthorscopic surgery on 03/24/17 to remove his medial meniscus and he states that his pain has improved since the surgery. He states that it will still wake him up at night but as long as he is moving, his pain is pretty well-managed. He states that he can't get his socks on good because he has difficulty bending his knee enough. He denies any popping or clicking since the surgery. He denies any aggravating factors and reports moving relieves his pain. He has increased redness on his shin, which he reports his from accidentally rubbing "chemo cream" on his shin for over a month; he thought he was using a topical pain-relieving cream but he accidentally used old "chemo cream" that he had for skin cancer on his face over a year ago. He is going to see his dermatologist today regarding this.   Pertinent History HTN   How long can you sit comfortably? no issue   How long can you stand comfortably? if he stands for a long time it bothers him but he isn't sure of how long   How long can you walk comfortably? no issues   Patient Stated Goals no pain and work like I normally do   Currently in Pain? No/denies            [facility] PT Assessment - 03/31/17 0001      Assessment   Medical Diagnosis s/p knee arthroscopy   Referring Provider Vickey Huger, MD    Onset Date/Surgical Date 03/24/17   Prior Therapy none for present issue     Precautions   Precautions None     Restrictions   Weight Bearing Restrictions No     Balance Screen   Has the patient fallen in the past 6 months No   Has the patient had a decrease in activity level because of a fear of falling?  No   Is the patient reluctant to leave their home because of a fear of falling?  No     Prior Function   Level of Independence Independent;Independent with basic ADLs   Leisure farming     ROM / Strength   AROM / PROM / Strength AROM;Strength     AROM   Overall AROM Comments L knee AROM WNL   Right Knee Extension 8   Right Knee Flexion 92     Strength   Right Hip Flexion 4+/5   Right Hip Extension 4/5   Right Hip ABduction 4/5   Left Hip Flexion 5/5   Left Hip Extension 3+/5   Left Hip ABduction 4/5   Right Knee Flexion 5/5   Right Knee Extension 4+/5   Left Knee Flexion 5/5   Left Knee Extension 5/5   Right Ankle Dorsiflexion 5/5   Left Ankle Dorsiflexion 5/5     Flexibility  Soft Tissue Assessment /Muscle Length yes   Hamstrings (90/90): R: 123 deg, L: 127 deg   Quadriceps +Ely's BLE     Palpation   Patella mobility WNL   Palpation comment min to mod soft tissue restrictions of quads, HS, and ITB; tenderness to palpation throughout     Ambulation/Gait   Ambulation Distance (Feet) 658 Feet  3MWT   Assistive device None   Gait Pattern Within Functional Limits   Gait Comments pt had bil toe out and trendelenberg throughout gait but otherwise [facility]     Balance   Balance Assessed Yes     Static Standing Balance   Static Standing - Balance Support No upper extremity supported   Static Standing Balance -  Activities  Single Leg Stance - Right Leg;Single Leg Stance - Left Leg   Static Standing - Comment/# of Minutes R: 4 sec or < L:1 sec or <     Standardized Balance Assessment   Standardized Balance Assessment Five Times Sit to Stand   Five times sit  to stand comments  12 sec from chair with no UE; increased weight shift over LLE          Objective measurements completed on examination: See above findings.          [facility] Adult PT Treatment/Exercise - 03/31/17 0001      Exercises   Exercises Knee/Hip     Knee/Hip Exercises: Stretches   Active Hamstring Stretch Right;2 reps;30 seconds   Active Hamstring Stretch Limitations supine with sheet     Knee/Hip Exercises: Supine   Heel Slides Right;10 reps   Heel Slides Limitations HEP demo                PT Short Term Goals - 03/31/17 1023      PT SHORT TERM GOAL #1   Title Pt will be independent with HEP and perform consistently in order to promote return to PLOF and decrease risk of reinjury.   Time 2   Period Weeks   Status New   Target Date 04/14/17     PT SHORT TERM GOAL #2   Title Pt will have improved AROM from 5-100 to maximize gait and stair ambulation.   Time 2   Period Weeks   Status New     PT SHORT TERM GOAL #3   Title Pt will have improved 5xSTS to 10 sec or < with no UE and proper mechanics to demonstrate improved overall functional strength.   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   Time 4   Period Weeks  Status New                Plan - 03/31/17 1030    Clinical Impression Statement Pt is pleasant 83 YO M who presents to OPPT  s/p R knee arthroscopy for medial menisectomy on 03/24/17. Pt has deficits in AROM, MMT, flexibility, balance, functional strength, stair ambulation, and mild deficits in gait. He also has deficits in flexibility and was noted to have poor quad contraction on the R. Pt also noted to have skin irritation on R anterior shin due to him accidentally applying "chemo cream" on it instead of pain-relieving cream; he states he is going to see his dermatologist about it today but will continue to monitor in therapy sessions. Pt needs skilled PT intervention to address these deficits in order to maximize his overall function.   History and Personal Factors relevant to plan of care: HTN, motivated, active lifestyle   Clinical Presentation Stable   Clinical Presentation due to: AROM, MMT, balance, stairs, functional strength, gait   Clinical Decision Making Low   Rehab Potential Good   PT Frequency 2x / week   PT Duration 4 weeks   PT Treatment/Interventions ADLs/Self Care Home Management;Cryotherapy;Electrical Stimulation;Moist Heat;Gait training;Stair training;Functional mobility training;Therapeutic activities;Therapeutic exercise;Balance training;Neuromuscular re-education;Patient/family education;Manual techniques;Scar mobilization;Passive range of motion;Dry needling;Energy conservation;Taping   PT Next Visit Plan reivew goals and HEP, measure circumferential edema at joint line; prone quad stretch, calf stretching, quad sets, SAQ, LAQ, bridging, sit <> stands, and other functional strengthening; manual as needed for edema and soft tissue restrictions   PT Home Exercise Plan eval: HS stretch, heel slides   Consulted and Agree with Plan of Care Patient      Patient will benefit from skilled therapeutic intervention in order to improve the following deficits and impairments:  Decreased balance, Abnormal gait, Decreased range of motion, Decreased strength, Increased muscle spasms, Impaired flexibility, Obesity,  Improper body mechanics  Visit Diagnosis: Stiffness of right knee, not elsewhere classified - Plan: PT plan of care cert/re-cert  Acute pain of right knee - Plan: PT plan of care cert/re-cert  Muscle weakness (generalized) - Plan: PT plan of care cert/re-cert  Other symptoms and signs involving the musculoskeletal system - Plan: PT plan of care cert/re-cert      G-Codes - 35/45/62 1033    Functional Assessment Tool Used (Outpatient Only) clinical judgement, 5xSTS, SLS, stair ambulation, MMT   Functional Limitation Mobility: Walking and moving around   Mobility: Walking and Moving Around Current Status (B6389) At least 20 percent but less than 40 percent impaired, limited or restricted   Mobility: Walking and Moving Around Goal Status (H7342) At least 1 percent but less than 20 percent impaired, limited or restricted       Problem List There are no active problems to display for this patient.    Geraldine Solar PT, [place] [address] [place], [place], [address] Phone: [phone]   Fax:  [phone]  Name: Tommy Barton MRN: [id number] Date of Birth: 1932-04-11

## 2017-04-05 ENCOUNTER — Ambulatory Visit (HOSPITAL_COMMUNITY): Payer: Medicare Other | Admitting: Physical Therapy

## 2017-04-05 DIAGNOSIS — R29898 Other symptoms and signs involving the musculoskeletal system: Secondary | ICD-10-CM

## 2017-04-05 DIAGNOSIS — M6281 Muscle weakness (generalized): Secondary | ICD-10-CM

## 2017-04-05 DIAGNOSIS — M25661 Stiffness of right knee, not elsewhere classified: Secondary | ICD-10-CM | POA: Diagnosis not present

## 2017-04-05 DIAGNOSIS — M25561 Pain in right knee: Secondary | ICD-10-CM | POA: Diagnosis not present

## 2017-04-05 NOTE — Therapy (Signed)
[place], [place], [address] Phone: [phone]   Fax:  [phone]  Physical Therapy Treatment  Patient Details  Name: Tommy Barton MRN: [id number] Date of Birth: 10-20-1931 Referring Provider: Vickey Huger, MD  Encounter Date: 04/05/2017    Past Medical History:  Diagnosis Date  . Hypertension     No past surgical history on file.  There were no vitals filed for this visit.      Subjective Assessment - 04/05/17 1427    Subjective Pt states he is doing well today . Reports complaince with HEP.  Still with visible redness from "chemo cream" that he thought was pain cream.  No signs/symptoms of infection.   Currently in Pain? No/denies            [facility] PT Assessment - 04/05/17 0001      Assessment   Medical Diagnosis s/p knee arthroscopy     Functional Tests   Functional tests Other     Other:   Other/ Comments circumferential measurements:  midpatella Rt:51cm Lt: 50cm, suprapatella R: 54cm, Lt 52cm     AROM   Right Knee Extension 5   Right Knee Flexion 108                     [facility] Adult PT Treatment/Exercise - 04/05/17 0001      Knee/Hip Exercises: Stretches   Active Hamstring Stretch Right;30 seconds;4 reps   Active Hamstring Stretch Limitations standing 12" box and reviewed supine with sheet   Knee: Self-Stretch to increase Flexion Right;10 seconds   Knee: Self-Stretch Limitations 10 reps   Gastroc Stretch Both;3 reps;20 seconds   Gastroc Stretch Limitations using slant board     Knee/Hip Exercises: Supine   Heel Slides Right;10 reps   Heel Slides Limitations HEP review   Bridges Limitations 10 reps   Straight Leg Raises Right;10 reps   Knee Extension Limitations 5   Knee Flexion Limitations 108     Manual Therapy   Manual Therapy Soft tissue mobilization   Manual therapy comments completed seperately from all other skilled interventions   Soft tissue mobilization  to Rt knee with elevation to decrease edema and encourage reduction in adhesions                PT Education - 04/05/17 1425    Education provided Yes   Education Details reviewed HEP and given copy of eval findings.  Reviewed goals.  Educated to stretch when he has cramps   Person(s) Educated Patient   Methods Explanation;Demonstration;Tactile cues;Verbal cues;Handout   Comprehension Verbalized understanding;Returned demonstration;Verbal cues required;Tactile cues required          PT Short Term Goals - 03/31/17 1023      PT SHORT TERM GOAL #1   Title Pt will be independent with HEP and perform consistently in order to promote return to PLOF and decrease risk of reinjury.   Time 2   Period Weeks   Status New   Target Date 04/14/17     PT SHORT TERM GOAL #2   Title Pt will have improved AROM from 5-100 to maximize gait and stair ambulation.   Time 2   Period Weeks   Status New     PT SHORT TERM GOAL #3   Title Pt will have improved 5xSTS to 10 sec or < with no UE and proper mechanics to demonstrate improved overall functional strength.   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   Time 4   Period Weeks   Status New               Plan - 04/05/17 1410    Clinical Impression Statement reviewed HEP and goals per intial evaluation.  Measured circumferential  edema with 1cm difference mid patella and 2 cm difference superior patella.  Completed gentle sofft tissue to promote removal of edema as well as myofascial to surrwounding tissue to decrease scar.  Pt tolerated well with increae in ROM this session to 5-105 (was 8-92 last session).  Pt with episode of upper hamstring cramping when initiated bridge but was able to resolve with stretching.  completed remaineder of reps without incident.  Instructed with standing hamstring, gastroc and knee flexion stretches with good results.  Encouraged continuation of HEP.   Rehab Potential Good   PT Frequency 2x / week   PT Duration 4 weeks   PT Treatment/Interventions ADLs/Self Care Home Management;Cryotherapy;Electrical Stimulation;Moist Heat;Gait training;Stair training;Functional mobility training;Therapeutic activities;Therapeutic exercise;Balance training;Neuromuscular re-education;Patient/family education;Manual techniques;Scar mobilization;Passive range of motion;Dry needling;Energy conservation;Taping   PT Next Visit Plan Next session add prone quad stretch.  Give written isntructions for calf stretching and standing hamstring stretch. Add sit <> stands and other functional strengthening; manual as needed for edema and soft tissue restrictions   PT Home Exercise Plan eval: HS stretch, heel slides   Consulted and Agree with Plan of Care Patient      Patient will benefit from skilled therapeutic intervention in order to improve the following deficits and impairments:  Decreased balance, Abnormal gait, Decreased range of motion, Decreased strength, Increased muscle spasms, Impaired flexibility, Obesity, Improper body mechanics  Visit Diagnosis: Stiffness of right knee, not elsewhere classified  Acute pain of right knee  Muscle weakness (generalized)  Other symptoms and signs involving the musculoskeletal system     Problem List There are no active problems to display for this patient.  Teena Irani, PTA/CLT [phone]  Teena Irani 04/05/2017, 2:40 PM  [place], [place], [address] Phone: [phone]   Fax:  [phone]  Name: Tommy Barton MRN: [id number] Date of Birth: 11-24-1931

## 2017-04-07 ENCOUNTER — Telehealth (HOSPITAL_COMMUNITY): Payer: Self-pay | Admitting: Physical Therapy

## 2017-04-07 NOTE — Telephone Encounter (Signed)
He is going out of town for a few days and will return to rehab on 04-13-17.

## 2017-04-08 ENCOUNTER — Ambulatory Visit (HOSPITAL_COMMUNITY): Payer: Medicare Other | Admitting: Physical Therapy

## 2017-04-13 ENCOUNTER — Ambulatory Visit (HOSPITAL_COMMUNITY): Payer: Medicare Other | Admitting: Physical Therapy

## 2017-04-13 DIAGNOSIS — M25661 Stiffness of right knee, not elsewhere classified: Secondary | ICD-10-CM | POA: Diagnosis not present

## 2017-04-13 DIAGNOSIS — R29898 Other symptoms and signs involving the musculoskeletal system: Secondary | ICD-10-CM | POA: Diagnosis not present

## 2017-04-13 DIAGNOSIS — M25561 Pain in right knee: Secondary | ICD-10-CM | POA: Diagnosis not present

## 2017-04-13 DIAGNOSIS — M6281 Muscle weakness (generalized): Secondary | ICD-10-CM | POA: Diagnosis not present

## 2017-04-13 NOTE — Patient Instructions (Signed)
Calf Stretch    Stand with hands supported on wall, elbows slightly bent, front knee bent, back knee straight, feet parallel and both heels on floor. Lean into wall by pushing hips forward until a stretch is felt in calf muscle. Hold __30__ seconds. Repeat with leg positions switched.  Hamstring Stretch (Standing)    Standing, place one heel on chair or bench. Use one or both hands on thigh for support. Keeping torso straight, lean forward slowly until a stretch is felt in back of same thigh. Hold _30___ seconds. Repeat with other leg.

## 2017-04-13 NOTE — Therapy (Signed)
[place], [place], [address] Phone: [phone]   Fax:  [phone]  Physical Therapy Treatment  Patient Details  Name: Tommy Barton MRN: [id number] Date of Birth: 1932/08/06 Referring Provider: Vickey Huger, MD  Encounter Date: 04/13/2017      PT End of Session - 04/13/17 0857    Visit Number 3   Number of Visits 12   Date for PT Re-Evaluation 04/28/17   Authorization Type medicare   Authorization Time Period cert 9/5-1/88   Authorization - Visit Number 3   Authorization - Number of Visits 10   PT Start Time 0816   PT Stop Time 0858   PT Time Calculation (min) 42 min   Activity Tolerance Patient tolerated treatment well   Behavior During Therapy [facility] for tasks assessed/performed      Past Medical History:  Diagnosis Date  . Hypertension     No past surgical history on file.  There were no vitals filed for this visit.      Subjective Assessment - 04/13/17 0820    Subjective Pt states he went to the beach last week.  States he came back Friday and has been hurting worse on Saturday and Sunday since he's been back.  Currently 5/10   Currently in Pain? Yes   Pain Score 5    Pain Location Knee   Pain Orientation Right   Pain Descriptors / Indicators Aching                         [facility] Adult PT Treatment/Exercise - 04/13/17 0001      Knee/Hip Exercises: Stretches   Active Hamstring Stretch Right;2 reps;30 seconds   Active Hamstring Stretch Limitations standing 12" box and reviewed supine with sheet   Knee: Self-Stretch to increase Flexion Right;10 seconds   Knee: Self-Stretch Limitations 10 reps   Gastroc Stretch Both;2 reps;30 seconds   Gastroc Stretch Limitations using slant board     Knee/Hip Exercises: Standing   Heel Raises Both;10 reps   Heel Raises Limitations toeraises 10 reps     Knee/Hip Exercises: Seated   Sit to Sand 10 reps;without UE support     Knee/Hip  Exercises: Supine   Short Arc Quad Sets Both;10 reps   Bridges Limitations 10 reps   Straight Leg Raises Right;10 reps   Knee Extension Limitations 3   Knee Flexion Limitations 112                  PT Short Term Goals - 03/31/17 1023      PT SHORT TERM GOAL #1   Title Pt will be independent with HEP and perform consistently in order to promote return to PLOF and decrease risk of reinjury.   Time 2   Period Weeks   Status New   Target Date 04/14/17     PT SHORT TERM GOAL #2   Title Pt will have improved AROM from 5-100 to maximize gait and stair ambulation.   Time 2   Period Weeks   Status New     PT SHORT TERM GOAL #3   Title Pt will have improved 5xSTS to 10 sec or < with no UE and proper mechanics to demonstrate improved overall functional strength.   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   Time 4   Period Weeks   Status New               Plan - 04/13/17 7124    Clinical Impression Statement Continued with established POC.  Reviewed stretches added last session and given written instructions for HEP.  Noted improvement in ROM (3-112, was 5-105 last session) and some reduction in swelling.  Continued with retro massage to address swelling.  Noted scaring at surgical sites with deep scar massage to these areas to reduce.   Added sit to stand and attempted  quad stretch, however prefers not to assume prone positioning.     Rehab Potential Good   PT Frequency 2x / week   PT Duration 4 weeks   PT Treatment/Interventions ADLs/Self Care Home Management;Cryotherapy;Electrical Stimulation;Moist Heat;Gait training;Stair training;Functional mobility training;Therapeutic activities;Therapeutic exercise;Balance training;Neuromuscular re-education;Patient/family education;Manual techniques;Scar mobilization;Passive range of motion;Dry needling;Energy conservation;Taping   PT Next Visit Plan Progress functional strengthening; manual as needed for edema and soft tissue restrictions   PT Home Exercise Plan eval: HS stretch, heel slides; 8l/14: standing hamstring stretch and gastroc stretch   Consulted and Agree with Plan of Care Patient      Patient will benefit from skilled therapeutic intervention in order to improve the following deficits and impairments:  Decreased balance, Abnormal gait, Decreased range of motion, Decreased strength, Increased muscle spasms, Impaired flexibility, Obesity, Improper body mechanics  Visit Diagnosis: Stiffness of right knee, not elsewhere classified  Acute pain of right knee  Muscle weakness (generalized)  Other symptoms and signs involving the musculoskeletal system     Problem List There are no active problems to display for this patient.  Teena Irani, PTA/CLT [phone]  Teena Irani 04/13/2017, 9:02 AM  [place], [place], [address] Phone: [phone]   Fax:  [phone]  Name: Tommy Barton MRN: [id number] Date of Birth: 06/08/1932

## 2017-04-15 ENCOUNTER — Ambulatory Visit (HOSPITAL_COMMUNITY): Payer: Medicare Other | Admitting: Physical Therapy

## 2017-04-15 DIAGNOSIS — R29898 Other symptoms and signs involving the musculoskeletal system: Secondary | ICD-10-CM

## 2017-04-15 DIAGNOSIS — M25661 Stiffness of right knee, not elsewhere classified: Secondary | ICD-10-CM

## 2017-04-15 DIAGNOSIS — M6281 Muscle weakness (generalized): Secondary | ICD-10-CM

## 2017-04-15 DIAGNOSIS — M25561 Pain in right knee: Secondary | ICD-10-CM

## 2017-04-15 NOTE — Therapy (Signed)
[place], [place], [address] Phone: [phone]   Fax:  [phone]  Physical Therapy Treatment  Patient Details  Name: Tommy Barton MRN: [id number] Date of Birth: September 03, 1931 Referring Provider: Vickey Huger, MD  Encounter Date: 04/15/2017      PT End of Session - 04/15/17 0954    Visit Number 4   Number of Visits 12   Date for PT Re-Evaluation 04/28/17   Authorization Type medicare   Authorization Time Period cert 8/5-4/62   Authorization - Visit Number 4   Authorization - Number of Visits 10   PT Start Time 0901   PT Stop Time 0950   PT Time Calculation (min) 49 min   Activity Tolerance Patient tolerated treatment well   Behavior During Therapy [facility] for tasks assessed/performed      Past Medical History:  Diagnosis Date  . Hypertension     No past surgical history on file.  There were no vitals filed for this visit.      Subjective Assessment - 04/15/17 0905    Subjective Pt reports achiness presents, especially at night.  reports he has to "watch my walking when I first get up" but then gets better to the point that he has no pain until he gets still again.    Currently in Pain? Yes   Pain Score 5    Pain Location Knee   Pain Orientation Right   Pain Descriptors / Indicators Aching   Pain Type Chronic pain                         [facility] Adult PT Treatment/Exercise - 04/15/17 0001      Knee/Hip Exercises: Stretches   Knee: Self-Stretch to increase Flexion Right;10 seconds   Knee: Self-Stretch Limitations 10 reps     Knee/Hip Exercises: Standing   Heel Raises Both;15 reps   Heel Raises Limitations toeraises 15 reps   Forward Lunges Both;10 reps   Forward Lunges Limitations onto 4" step no UE's   Lateral Step Up Right;10 reps;Hand Hold: 1;Step Height: 4"   Lateral Step Up Limitations 4" 1 UE assist   Forward Step Up Right;10 reps;Step Height: 4";Hand Hold: 1   Forward  Step Up Limitations 4' 1 UE assist     Knee/Hip Exercises: Seated   Sit to Sand 10 reps;without UE support     Knee/Hip Exercises: Supine   Short Arc Quad Sets Both;15 reps   Bridges Limitations 15 reps   Straight Leg Raises Right;15 reps   Knee Extension Limitations 3   Knee Flexion Limitations 118     Manual Therapy   Manual Therapy Soft tissue mobilization;Edema management   Manual therapy comments completed seperately from all other skilled interventions   Soft tissue mobilization to Rt knee with elevation to decrease edema and encourage reduction in adhesions                  PT Short Term Goals - 03/31/17 1023      PT SHORT TERM GOAL #1   Title Pt will be independent with HEP and perform consistently in order to promote return to PLOF and decrease risk of reinjury.   Time 2   Period Weeks   Status New   Target Date 04/14/17     PT SHORT TERM GOAL #2   Title Pt will have improved AROM from 5-100 to maximize gait and stair ambulation.  Time 2   Period Weeks   Status New     PT SHORT TERM GOAL #3   Title Pt will have improved 5xSTS to 10 sec or < with no UE and proper mechanics to demonstrate improved overall functional strength.   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   Time 4   Period Weeks   Status New               Plan - 04/15/17 1220     Clinical Impression Statement Began step ups this session to increase LE strength and stability.  Required cues for form and completing in slow controlled movements.  Pt also with cues to turn feet into neutral with exericses due to tendency to keep externally rotated.   Continued manual due to persistent swelling perimeter of knee.  ROM today Rt knee 3-118 degrees.  Continues to improve.   Rehab Potential Good   PT Frequency 2x / week   PT Duration 4 weeks   PT Treatment/Interventions ADLs/Self Care Home Management;Cryotherapy;Electrical Stimulation;Moist Heat;Gait training;Stair training;Functional mobility training;Therapeutic activities;Therapeutic exercise;Balance training;Neuromuscular re-education;Patient/family education;Manual techniques;Scar mobilization;Passive range of motion;Dry needling;Energy conservation;Taping   PT Next Visit Plan Progress functional strengthening; manual as needed for edema and soft tissue restrictions.  increase step height as able to simulate tractor.   PT Home Exercise Plan eval: HS stretch, heel slides; 8l/14: standing hamstring stretch and gastroc stretch   Consulted and Agree with Plan of Care Patient      Patient will benefit from skilled therapeutic intervention in order to improve the following deficits and impairments:  Decreased balance, Abnormal gait, Decreased range of motion, Decreased strength, Increased muscle spasms, Impaired flexibility, Obesity, Improper body mechanics  Visit Diagnosis: Stiffness of right knee, not elsewhere classified  Acute pain of right knee  Muscle weakness (generalized)  Other symptoms and signs involving the musculoskeletal system     Problem List There are no active problems to display for this patient.  Teena Irani, PTA/CLT [phone]  Teena Irani 04/15/2017, 12:24 PM  [place] [address] [place], [place], [address] Phone: [phone]   Fax:   [phone]  Name: Tommy Barton MRN: [id number] Date of Birth: April 26, 1932

## 2017-04-19 ENCOUNTER — Ambulatory Visit (HOSPITAL_COMMUNITY): Payer: Medicare Other

## 2017-04-19 ENCOUNTER — Encounter (HOSPITAL_COMMUNITY): Payer: Self-pay

## 2017-04-19 DIAGNOSIS — M6281 Muscle weakness (generalized): Secondary | ICD-10-CM | POA: Diagnosis not present

## 2017-04-19 DIAGNOSIS — M25561 Pain in right knee: Secondary | ICD-10-CM

## 2017-04-19 DIAGNOSIS — R29898 Other symptoms and signs involving the musculoskeletal system: Secondary | ICD-10-CM | POA: Diagnosis not present

## 2017-04-19 DIAGNOSIS — M25661 Stiffness of right knee, not elsewhere classified: Secondary | ICD-10-CM | POA: Diagnosis not present

## 2017-04-19 NOTE — Therapy (Signed)
[place], [place], [address] Phone: [phone]   Fax:  [phone]  Physical Therapy Treatment  Patient Details  Name: Tommy Barton MRN: [id number] Date of Birth: [birth year] Referring Provider: Vickey Huger, MD  Encounter Date: 04/19/2017      PT End of Session - 04/19/17 0816    Visit Number 5   Number of Visits 12   Date for PT Re-Evaluation 04/28/17   Authorization Type medicare   Authorization Time Period cert 0/2-7/74   Authorization - Visit Number 5   Authorization - Number of Visits 10   PT Start Time 0815   PT Stop Time 0856   PT Time Calculation (min) 41 min   Activity Tolerance Patient tolerated treatment well   Behavior During Therapy [facility] for tasks assessed/performed      Past Medical History:  Diagnosis Date  . Hypertension     History reviewed. No pertinent surgical history.  There were no vitals filed for this visit.      Subjective Assessment - 04/19/17 0817    Subjective Pt states that he is pretty good but his knee hurts worse every morning. He states that his whole leg hurts him.   Currently in Pain? Yes   Pain Score 3    Pain Location Knee   Pain Orientation Right   Pain Descriptors / Indicators Aching   Pain Type Chronic pain   Pain Onset 1 to 4 weeks ago   Pain Frequency Intermittent   Aggravating Factors  unsure   Pain Relieving Factors moving around   Effect of Pain on Daily Activities increases             [facility] Adult PT Treatment/Exercise - 04/19/17 0001      Knee/Hip Exercises: Stretches   Active Hamstring Stretch Right;3 reps;30 seconds   Active Hamstring Stretch Limitations supine with rope   Quad Stretch Right;3 reps;30 seconds   Quad Stretch Limitations prone with rope     Knee/Hip Exercises: Standing   Terminal Knee Extension Limitations 10x5" holds with GTB   Lateral Step Up Right;2 sets;10 reps;Hand Hold: 0;Step Height: 4"   Forward Step Up  Right;2 sets;10 reps;Hand Hold: 0;Step Height: 8"   Step Down Right;2 sets;10 reps;Hand Hold: 0;Step Height: 6"   Step Down Limitations cues for decreased foot ER   SLS with Vectors BLE, 5RTx3" holds each   Other Standing Knee Exercises sidestepping 81ftx3RT with GTB   Other Standing Knee Exercises heel taps on 4" step x15 reps     Knee/Hip Exercises: Supine   Bridges Limitations 15 reps  GTB, 2-3 sec hold   Knee Extension Limitations 0   Knee Flexion Limitations 120              PT Education - 04/19/17 0856    Education provided Yes   Education Details updated HEP, exercise technique   Person(s) Educated Patient   Methods Explanation;Demonstration;Handout   Comprehension Verbalized understanding;Returned demonstration          PT Short Term Goals - 03/31/17 1023      PT SHORT TERM GOAL #1   Title Pt will be independent with HEP and perform consistently in order to promote return to PLOF and decrease risk of reinjury.   Time 2   Period Weeks   Status New   Target Date 04/14/17     PT SHORT TERM GOAL #2   Title Pt will have improved AROM from 5-100  to maximize gait and stair ambulation.   Time 2   Period Weeks   Status New     PT SHORT TERM GOAL #3   Title Pt will have improved 5xSTS to 10 sec or < with no UE and proper mechanics to demonstrate improved overall functional strength.   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   Time 4   Period Weeks   Status New               Plan - 04/19/17 0857    Clinical Impression Statement Session focused on RLE strengthening and balance. Pt tolerated therex well with only mild discomfort noted during fwd step downs. 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 Updated pt's HEP to include hip strengthening and he verbalized understanding. Pt's AROM 0-120 this date. Continue POC as planned.   Rehab Potential Good   PT Frequency 2x / week   PT Duration 4 weeks   PT Treatment/Interventions ADLs/Self Care Home Management;Cryotherapy;Electrical Stimulation;Moist Heat;Gait training;Stair training;Functional mobility training;Therapeutic activities;Therapeutic exercise;Balance training;Neuromuscular re-education;Patient/family education;Manual techniques;Scar mobilization;Passive range of motion;Dry needling;Energy conservation;Taping   PT Next Visit Plan continue to progress functional strengthening; manual as needed for edema and soft tissue restrictions.   PT Home Exercise Plan eval: HS stretch, heel slides; 8l/14: standing hamstring stretch and gastroc stretch; 8/20: bridging with band, step ups, sidestepping with band   Consulted and Agree with Plan of Care Patient      Patient will benefit from skilled therapeutic intervention in order to improve the following deficits and impairments:  Decreased balance, Abnormal gait, Decreased range of motion, Decreased strength, Increased muscle spasms, Impaired flexibility, Obesity, Improper body mechanics  Visit Diagnosis: Stiffness of right knee, not elsewhere classified  Acute pain of right knee  Muscle weakness (generalized)  Other symptoms and signs involving the musculoskeletal system     Problem List There are no active problems to display for this  patient.     Geraldine Solar PT, [place] [address] [place], [place], [address] Phone: [phone]   Fax:  [phone]  Name: Tommy Barton MRN: [id number] Date of Birth: 1932/07/08

## 2017-04-19 NOTE — Patient Instructions (Signed)
  BRIDGING ELASTIC BAND ABDUCTION  While lying on your back, place an elastic band around your knees and pull your knees apart. Hold this and then tighten your lower abdominals, squeeze your buttocks and raise your buttocks off the floor/bed as creating a "Bridge" with your body.  Perform 1x/day, 2-3 sets of 15 reps with green band   Step Ups  Start by facing the step. Step up with the affected leg, followed by the unaffected leg. Step back down with the unaffected leg, followed by the affected leg.   Perform 1x/day, 2-3 sets of 15 reps   ELASTIC BAND LATERAL WALKS   With an elastic band around both ankles, walk to the side while keeping your feet spread apart. Keep your knees bent the entire time.  Perform 1x/day, 5-10 laps along kitchen counter with green band

## 2017-04-22 ENCOUNTER — Ambulatory Visit (HOSPITAL_COMMUNITY): Payer: Medicare Other

## 2017-04-22 ENCOUNTER — Encounter (HOSPITAL_COMMUNITY): Payer: Self-pay

## 2017-04-22 DIAGNOSIS — M6281 Muscle weakness (generalized): Secondary | ICD-10-CM

## 2017-04-22 DIAGNOSIS — M25561 Pain in right knee: Secondary | ICD-10-CM | POA: Diagnosis not present

## 2017-04-22 DIAGNOSIS — M25661 Stiffness of right knee, not elsewhere classified: Secondary | ICD-10-CM | POA: Diagnosis not present

## 2017-04-22 DIAGNOSIS — R29898 Other symptoms and signs involving the musculoskeletal system: Secondary | ICD-10-CM | POA: Diagnosis not present

## 2017-04-22 NOTE — Therapy (Signed)
[place], [place], [address] Phone: [phone]   Fax:  [phone]  Physical Therapy Treatment  Patient Details  Name: Tommy Barton MRN: [id number] Date of Birth: 21-Mar-1932 Referring Provider: Vickey Huger, MD  Encounter Date: 04/22/2017      PT End of Session - 04/22/17 0857    Visit Number 6   Number of Visits 12   Date for PT Re-Evaluation 04/28/17   Authorization Type medicare   Authorization Time Period cert 1/0-6/26   Authorization - Visit Number 6   Authorization - Number of Visits 10   PT Start Time 9485   PT Stop Time 0941   PT Time Calculation (min) 44 min   Activity Tolerance Patient tolerated treatment well   Behavior During Therapy [facility] for tasks assessed/performed      Past Medical History:  Diagnosis Date  . Hypertension     History reviewed. No pertinent surgical history.  There were no vitals filed for this visit.      Subjective Assessment - 04/22/17 0857    Subjective Pt states that he feels pretty good. He had a cramp in his thigh Monday night, and he is sore there this morning. But otherwise, no issues.   Currently in Pain? No/denies   Pain Onset 1 to 4 weeks ago             [facility] Adult PT Treatment/Exercise - 04/22/17 0001      Knee/Hip Exercises: Aerobic   Stationary Bike x3 mins at beginning of session, L3, seat 14 (unbilled)     Knee/Hip Exercises: Machines for Strengthening   Cybex Knee Extension BLE x20 reps with 4.5 plates   Cybex Knee Flexion BLE x20 reps with 4 plates   Total Gym Leg Press x20 reps BLE 34deg; 2x10 reps RLE only 25deg     Knee/Hip Exercises: Standing   Functional Squat 2 sets;10 reps   Functional Squat Limitations goblet squats with 8# DB   Wall Squat 2 sets;10 reps   Wall Squat Limitations wall sits 5x10" holds   SLS with Vectors RLE only, 5RTX3" holds each   Other Standing Knee Exercises sidestepping 36ftx3RT with GTB   Other  Standing Knee Exercises fwd/retro tandem gait on foam x1RT each               PT Education - 04/22/17 0943    Education provided Yes   Education Details exercise technique, continue HEP   Person(s) Educated Patient   Methods Explanation;Demonstration   Comprehension Verbalized understanding;Returned demonstration            PT Short Term Goals - 03/31/17 1023      PT SHORT TERM GOAL #1   Title Pt will be independent with HEP and perform consistently in order to promote return to PLOF and decrease risk of reinjury.   Time 2   Period Weeks   Status New   Target Date 04/14/17     PT SHORT TERM GOAL #2   Title Pt will have improved AROM from 5-100 to maximize gait and stair ambulation.   Time 2   Period Weeks   Status New     PT SHORT TERM GOAL #3   Title Pt will have improved 5xSTS to 10 sec or < with no UE and proper mechanics to demonstrate improved overall functional strength.   Time 2   Period Weeks   Status New  PT Long Term Goals - 03/31/17 1025      PT LONG TERM GOAL #1   Title Pt will have improved AROM to at least 0-115 to maximize his ability to step up into his tractor.   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   Time 4   Period Weeks   Status New               Plan - 04/22/17 0941    Clinical Impression Statement Today's session focused on functional and BLE strengthening. Pt did very well throughout entire session, not reporting any pain during or after session. He did require cues for proper therex technique. Continue POC as planned.   Rehab  Potential Good   PT Frequency 2x / week   PT Duration 4 weeks   PT Treatment/Interventions ADLs/Self Care Home Management;Cryotherapy;Electrical Stimulation;Moist Heat;Gait training;Stair training;Functional mobility training;Therapeutic activities;Therapeutic exercise;Balance training;Neuromuscular re-education;Patient/family education;Manual techniques;Scar mobilization;Passive range of motion;Dry needling;Energy conservation;Taping   PT Next Visit Plan continue to progress functional strengthening; manual as needed for edema and soft tissue restrictions. single leg on cybex machines, add band to wall sits/squats   PT Home Exercise Plan eval: HS stretch, heel slides; 8l/14: standing hamstring stretch and gastroc stretch; 8/20: bridging with band, step ups, sidestepping with band   Consulted and Agree with Plan of Care Patient      Patient will benefit from skilled therapeutic intervention in order to improve the following deficits and impairments:  Decreased balance, Abnormal gait, Decreased range of motion, Decreased strength, Increased muscle spasms, Impaired flexibility, Obesity, Improper body mechanics  Visit Diagnosis: Stiffness of right knee, not elsewhere classified  Acute pain of right knee  Muscle weakness (generalized)  Other symptoms and signs involving the musculoskeletal system     Problem List There are no active problems to display for this patient.    Geraldine Solar PT, [place] [address] [place], [place], [address] Phone: [phone]   Fax:  [phone]  Name: Tommy Barton MRN: [id number] Date of Birth: 1932-05-10

## 2017-04-27 ENCOUNTER — Encounter (HOSPITAL_COMMUNITY): Payer: Self-pay

## 2017-04-27 ENCOUNTER — Ambulatory Visit (HOSPITAL_COMMUNITY): Payer: Medicare Other

## 2017-04-27 DIAGNOSIS — M25661 Stiffness of right knee, not elsewhere classified: Secondary | ICD-10-CM

## 2017-04-27 DIAGNOSIS — M6281 Muscle weakness (generalized): Secondary | ICD-10-CM | POA: Diagnosis not present

## 2017-04-27 DIAGNOSIS — M25561 Pain in right knee: Secondary | ICD-10-CM

## 2017-04-27 DIAGNOSIS — R29898 Other symptoms and signs involving the musculoskeletal system: Secondary | ICD-10-CM

## 2017-04-27 NOTE — Therapy (Signed)
[place], [place], [address] Phone: [phone]   Fax:  [phone]  Physical Therapy Treatment/Discharge Summary  Patient Details  Name: Tommy Barton MRN: [id number] Date of Birth: 1932-04-21 Referring Provider: Vickey Huger, MD  Encounter Date: 04/27/2017      PT End of Session - 04/27/17 0859    Visit Number 7   Number of Visits 9  POC 2x/week for 4 weeks    Date for PT Re-Evaluation 04/28/17   Authorization Type medicare   Authorization Time Period cert 1/3-2/44   Authorization - Visit Number 7   Authorization - Number of Visits 10   PT Start Time 0900   PT Stop Time 0918   PT Time Calculation (min) 18 min   Activity Tolerance Patient tolerated treatment well   Behavior During Therapy [facility] for tasks assessed/performed      Past Medical History:  Diagnosis Date  . Hypertension     History reviewed. No pertinent surgical history.  There were no vitals filed for this visit.      Subjective Assessment - 04/27/17 0902    Subjective Pt states that his knee still bugs him a little bit in the mornings but once he's up moving around he is fine. 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   Patient Stated Goals no pain and work like I normally do   Currently in Pain? No/denies   Pain Onset 1 to 4 weeks ago            [facility] PT Assessment - 04/27/17 0001      AROM   Right Knee Extension 0   Right Knee Flexion 120     Strength   Right Hip Flexion 5/5  was 4+   Right Hip Extension 4+/5  was 4   Right Hip ABduction 5/5  was 4   Left Hip Extension 4+/5  was 3+   Left Hip ABduction 4+/5  was 4   Right Knee Extension 5/5  was 4+     Balance   Balance Assessed Yes     Static Standing Balance   Static Standing - Balance Support No upper extremity supported   Static Standing Balance -  Activities  Single Leg Stance  - Right Leg;Single Leg Stance - Left Leg   Static Standing - Comment/# of Minutes R: 4 sec or less L: 3 sec or less     Standardized Balance Assessment   Standardized Balance Assessment Five Times Sit to Stand   Five times sit to stand comments  10 sec from chair with no UE            PT Education - 04/27/17 0923    Education provided Yes   Education Details discharge plans, HEP, return with referral if he needs to   [employer]) Educated Patient   Methods Explanation;Demonstration;Handout   Comprehension Verbalized understanding;Returned demonstration            PT Short Term Goals - 04/27/17 0913      PT SHORT TERM GOAL #1   Title Pt will be independent with HEP and perform consistently in order to promote return to PLOF and decrease risk of reinjury.   Time 2   Period Weeks   Status Achieved     PT SHORT TERM GOAL #2   Title Pt will have improved AROM from 5-100 to maximize  gait and stair ambulation.   Time 2   Period Weeks   Status Achieved     PT SHORT TERM GOAL #3   Title Pt will have improved 5xSTS to 10 sec or < with no UE and proper mechanics to demonstrate improved overall functional strength.   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   Time 4   Period Weeks   Status Achieved     PT LONG TERM GOAL #2   Title Pt will have improved SLS to at least 10 sec on BLE with no UE support to maximize gait on uneven ground when walking [place].   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   Baseline 05-02-2023: all strength has improved but proximal hips still 4+/5   Time 4   Period Weeks   Status Partially Met                Plan - May 01, 2017 0919    Clinical Impression Statement PT reassessed pt's goals and outcome measures this date. Pt has made gerat progress since starting therapy as he has met 4/6 goals, partially met 1, with 1 still on-going. 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 His only remaining issue is that he has a little pain in the mornings after waking up but once he is up and moving it loosens up and he has no pain. Due to progress made, pt will be discharged to his HEP. Pt agreeable to this plan and was educated that he could return with referral if he needed to.    Rehab Potential Good   PT Frequency 2x / week   PT Duration 4 weeks   PT Treatment/Interventions ADLs/Self Care Home Management;Cryotherapy;Electrical Stimulation;Moist Heat;Gait training;Stair training;Functional mobility training;Therapeutic activities;Therapeutic exercise;Balance training;Neuromuscular re-education;Patient/family education;Manual techniques;Scar mobilization;Passive range of motion;Dry needling;Energy conservation;Taping   PT Next Visit Plan continue to progress functional strengthening; manual as needed for edema and soft tissue restrictions. single leg on cybex machines, add band to wall sits/squats   PT Home Exercise Plan eval: HS stretch, heel slides; 8l/14: standing hamstring stretch and gastroc stretch; 8/20: bridging with band, step ups, sidestepping with band; 05-02-2023: SLS   Consulted and Agree with Plan of Care Patient      Patient will benefit from skilled therapeutic intervention in order to improve the following deficits and impairments:  Decreased balance, Abnormal gait, Decreased range of motion, Decreased strength, Increased muscle spasms, Impaired flexibility, Obesity, Improper body mechanics  Visit Diagnosis: Stiffness of right knee, not elsewhere  classified  Acute pain of right knee  Muscle weakness (generalized)  Other symptoms and signs involving the musculoskeletal system       G-Codes - 05-01-17 9983    Functional Assessment Tool Used (Outpatient Only) clinical judgement, 5xSTS, SLS, stair ambulation, MMT   Functional Limitation Mobility: Walking  and moving around   Mobility: Walking and Moving Around Goal Status [phone]) At least 1 percent but less than 20 percent impaired, limited or restricted   Mobility: Walking and Moving Around Discharge Status [phone]) At least 1 percent but less than 20 percent impaired, limited or restricted      Problem List There are no active problems to display for this patient.   PHYSICAL THERAPY DISCHARGE SUMMARY  Visits from Start of Care: 7  Current functional level related to goals / functional outcomes: See clinical impression above   Remaining deficits: See clinical impression above   Education / Equipment: HEP Plan: Patient agrees to discharge.  Patient goals were met. Patient is being discharged due to meeting the stated rehab goals.  ?????       Geraldine Solar PT, [place] [address] [place], [place], [address] Phone: [phone]   Fax:  [phone]  Name: Tommy Barton MRN: [id number] Date of Birth: 1932-08-07

## 2017-04-27 NOTE — Patient Instructions (Signed)
  SINGLE LEG STANCE - SLS  Stand on one leg and maintain your balance.  Perform along with your other exercises -- goal is to be able to hold this for 10 seconds on each leg with no hand support.

## 2017-04-29 ENCOUNTER — Ambulatory Visit (HOSPITAL_COMMUNITY): Payer: Medicare Other

## 2017-05-04 ENCOUNTER — Encounter (HOSPITAL_COMMUNITY): Payer: Medicare Other

## 2017-05-06 ENCOUNTER — Encounter (HOSPITAL_COMMUNITY): Payer: Medicare Other

## 2017-05-11 ENCOUNTER — Encounter (HOSPITAL_COMMUNITY): Payer: Medicare Other | Admitting: Physical Therapy

## 2017-05-13 ENCOUNTER — Encounter (HOSPITAL_COMMUNITY): Payer: Medicare Other

## 2017-08-13 DIAGNOSIS — Z85828 Personal history of other malignant neoplasm of skin: Secondary | ICD-10-CM | POA: Diagnosis not present

## 2017-08-13 DIAGNOSIS — Z08 Encounter for follow-up examination after completed treatment for malignant neoplasm: Secondary | ICD-10-CM | POA: Diagnosis not present

## 2017-08-13 DIAGNOSIS — L570 Actinic keratosis: Secondary | ICD-10-CM | POA: Diagnosis not present

## 2017-08-13 DIAGNOSIS — X32XXXA Exposure to sunlight, initial encounter: Secondary | ICD-10-CM | POA: Diagnosis not present

## 2017-08-13 DIAGNOSIS — L821 Other seborrheic keratosis: Secondary | ICD-10-CM | POA: Diagnosis not present

## 2017-08-19 DIAGNOSIS — E785 Hyperlipidemia, unspecified: Secondary | ICD-10-CM | POA: Diagnosis not present

## 2017-08-19 DIAGNOSIS — I10 Essential (primary) hypertension: Secondary | ICD-10-CM | POA: Diagnosis not present

## 2017-08-19 DIAGNOSIS — J180 Bronchopneumonia, unspecified organism: Secondary | ICD-10-CM | POA: Diagnosis not present

## 2017-08-19 DIAGNOSIS — E669 Obesity, unspecified: Secondary | ICD-10-CM | POA: Diagnosis not present

## 2017-08-27 DIAGNOSIS — J189 Pneumonia, unspecified organism: Secondary | ICD-10-CM | POA: Diagnosis not present

## 2017-08-27 DIAGNOSIS — J069 Acute upper respiratory infection, unspecified: Secondary | ICD-10-CM | POA: Diagnosis not present

## 2017-09-08 ENCOUNTER — Other Ambulatory Visit: Payer: Self-pay | Admitting: Internal Medicine

## 2017-09-08 ENCOUNTER — Ambulatory Visit
Admission: RE | Admit: 2017-09-08 | Discharge: 2017-09-08 | Disposition: A | Discharge status: Home / Self Care | Payer: Medicare HMO | Source: Ambulatory Visit | Attending: Internal Medicine | Admitting: Internal Medicine

## 2017-09-08 DIAGNOSIS — R5381 Other malaise: Secondary | ICD-10-CM | POA: Diagnosis not present

## 2017-09-08 DIAGNOSIS — R079 Chest pain, unspecified: Secondary | ICD-10-CM | POA: Diagnosis not present

## 2017-09-08 DIAGNOSIS — I10 Essential (primary) hypertension: Secondary | ICD-10-CM | POA: Diagnosis not present

## 2017-09-08 DIAGNOSIS — I517 Cardiomegaly: Secondary | ICD-10-CM | POA: Diagnosis not present

## 2017-09-08 DIAGNOSIS — J189 Pneumonia, unspecified organism: Secondary | ICD-10-CM

## 2017-09-08 DIAGNOSIS — J984 Other disorders of lung: Secondary | ICD-10-CM | POA: Diagnosis not present

## 2017-09-08 DIAGNOSIS — E7849 Other hyperlipidemia: Secondary | ICD-10-CM | POA: Diagnosis not present

## 2017-09-08 DIAGNOSIS — Z125 Encounter for screening for malignant neoplasm of prostate: Secondary | ICD-10-CM | POA: Diagnosis not present

## 2017-09-08 DIAGNOSIS — D5701 Hb-SS disease with acute chest syndrome: Secondary | ICD-10-CM | POA: Diagnosis not present

## 2017-09-08 DIAGNOSIS — J158 Pneumonia due to other specified bacteria: Secondary | ICD-10-CM | POA: Diagnosis not present

## 2017-09-17 DIAGNOSIS — D5701 Hb-SS disease with acute chest syndrome: Secondary | ICD-10-CM | POA: Diagnosis not present

## 2017-09-17 DIAGNOSIS — R079 Chest pain, unspecified: Secondary | ICD-10-CM | POA: Diagnosis not present

## 2017-09-17 DIAGNOSIS — J158 Pneumonia due to other specified bacteria: Secondary | ICD-10-CM | POA: Diagnosis not present

## 2017-09-17 DIAGNOSIS — I10 Essential (primary) hypertension: Secondary | ICD-10-CM | POA: Diagnosis not present

## 2018-08-27 DIAGNOSIS — B349 Viral infection, unspecified: Secondary | ICD-10-CM | POA: Diagnosis not present

## 2018-08-27 DIAGNOSIS — R05 Cough: Secondary | ICD-10-CM | POA: Diagnosis not present

## 2018-08-28 DIAGNOSIS — S0191XA Laceration without foreign body of unspecified part of head, initial encounter: Secondary | ICD-10-CM | POA: Diagnosis not present

## 2018-08-28 DIAGNOSIS — W1830XA Fall on same level, unspecified, initial encounter: Secondary | ICD-10-CM | POA: Diagnosis not present

## 2018-08-28 DIAGNOSIS — E871 Hypo-osmolality and hyponatremia: Secondary | ICD-10-CM | POA: Diagnosis not present

## 2018-08-28 DIAGNOSIS — J9811 Atelectasis: Secondary | ICD-10-CM | POA: Diagnosis not present

## 2018-08-28 DIAGNOSIS — J40 Bronchitis, not specified as acute or chronic: Secondary | ICD-10-CM | POA: Diagnosis not present

## 2018-08-28 DIAGNOSIS — R079 Chest pain, unspecified: Secondary | ICD-10-CM | POA: Diagnosis not present

## 2018-08-28 DIAGNOSIS — R55 Syncope and collapse: Secondary | ICD-10-CM | POA: Diagnosis not present

## 2018-08-28 DIAGNOSIS — J111 Influenza due to unidentified influenza virus with other respiratory manifestations: Secondary | ICD-10-CM | POA: Diagnosis not present

## 2018-08-28 DIAGNOSIS — I10 Essential (primary) hypertension: Secondary | ICD-10-CM | POA: Diagnosis not present

## 2018-08-28 DIAGNOSIS — J101 Influenza due to other identified influenza virus with other respiratory manifestations: Secondary | ICD-10-CM | POA: Diagnosis not present

## 2018-08-28 DIAGNOSIS — T502X5A Adverse effect of carbonic-anhydrase inhibitors, benzothiadiazides and other diuretics, initial encounter: Secondary | ICD-10-CM | POA: Diagnosis not present

## 2018-08-28 DIAGNOSIS — S81011A Laceration without foreign body, right knee, initial encounter: Secondary | ICD-10-CM | POA: Diagnosis not present

## 2018-08-28 DIAGNOSIS — E785 Hyperlipidemia, unspecified: Secondary | ICD-10-CM | POA: Diagnosis not present

## 2018-08-28 DIAGNOSIS — E876 Hypokalemia: Secondary | ICD-10-CM | POA: Diagnosis not present

## 2018-09-06 DIAGNOSIS — S81011A Laceration without foreign body, right knee, initial encounter: Secondary | ICD-10-CM | POA: Diagnosis not present

## 2018-09-06 DIAGNOSIS — Z9889 Other specified postprocedural states: Secondary | ICD-10-CM | POA: Diagnosis not present

## 2018-09-06 DIAGNOSIS — Z9181 History of falling: Secondary | ICD-10-CM | POA: Diagnosis not present

## 2018-09-13 ENCOUNTER — Other Ambulatory Visit: Payer: Self-pay

## 2018-09-13 NOTE — Patient Outreach (Signed)
[facility]) Care Management  09/13/2018  Tommy Barton 1932-04-17 [id number]      Telephone Screen  Referral Date: 09/12/2018 Referral Source: [employer] Report Referral Reason: unknown Insurance: [employer]   Incoming call from patient returning RN CM call. Patient voices that he Is doing well. He shares that he was in the hospital about two weeks ago. Patient states him and his friend travelled to [place]. While he was there he sustained a fall and "busted" an area of his knee and had to spend two nights in the hospital. He voices that his knee is healing and improving. He had follow up with specialist on last week and stitches were taken out. He voices that he was started on antibiotic as a precaution since the area looked slightly redden. Patient voices redness is improving. RN CM reviewed with patient s/s of infection and when to seek medical attention. He voiced understanding. He has not made PCP follow up yet but will do so today. Patient states he has no restrictions or limitations. He is ambulating on his own with no assistive device. He is driving and patient states he is back to working. He lives with supportive son and has daughter that is nearby and able to assist with any needs. Med review competed with patient. Patient able to manage his meds and denies any issues affording meds at this time. [facility] services reviewed and discussed with patient. He has no RN CM or [facility]N needs or concerns at this time. Patient agreeable to RN CM mailing out [place] for future reference. Patient voiced appreciation of follow up call.   Plan: RN CM will close case at this time. RN CM will send [facility] info via mail.  Enzo Montgomery, RN,BSN,CCM [place] Management Telephonic Care Management Coordinator Direct Phone: [phone] Toll Free: [phone] Fax: [phone]

## 2018-09-13 NOTE — Patient Outreach (Signed)
[facility]) Care Management  09/13/2018  JENNIE BOLAR May 29, 1932 [id number]   Telephone Screen  Referral Date: 09/12/2018 Referral Source: [employer] Report Referral Reason: unknown Insurance: [facility]   Outreach attempt # 1 to patient. No answer. RN CM left HIPAA compliant voicemail message along with contact info.    Plan: RN CM will make outreach attempt to patient within 3-4 business days. RN CM will send unsuccesful outreach letter to patient.    Enzo Montgomery, RN,BSN,CCM [place] Management Telephonic Care Management Coordinator Direct Phone: [phone] Toll Free: [phone] Fax: [phone]

## 2018-09-16 DIAGNOSIS — R5381 Other malaise: Secondary | ICD-10-CM | POA: Diagnosis not present

## 2018-09-16 DIAGNOSIS — E7849 Other hyperlipidemia: Secondary | ICD-10-CM | POA: Diagnosis not present

## 2018-09-16 DIAGNOSIS — I10 Essential (primary) hypertension: Secondary | ICD-10-CM | POA: Diagnosis not present

## 2018-09-16 DIAGNOSIS — R079 Chest pain, unspecified: Secondary | ICD-10-CM | POA: Diagnosis not present

## 2018-09-16 DIAGNOSIS — J158 Pneumonia due to other specified bacteria: Secondary | ICD-10-CM | POA: Diagnosis not present

## 2018-09-16 DIAGNOSIS — D5701 Hb-SS disease with acute chest syndrome: Secondary | ICD-10-CM | POA: Diagnosis not present

## 2018-09-16 DIAGNOSIS — Z125 Encounter for screening for malignant neoplasm of prostate: Secondary | ICD-10-CM | POA: Diagnosis not present

## 2018-09-16 DIAGNOSIS — E785 Hyperlipidemia, unspecified: Secondary | ICD-10-CM | POA: Diagnosis not present

## 2018-09-23 DIAGNOSIS — R079 Chest pain, unspecified: Secondary | ICD-10-CM | POA: Diagnosis not present

## 2018-09-23 DIAGNOSIS — J158 Pneumonia due to other specified bacteria: Secondary | ICD-10-CM | POA: Diagnosis not present

## 2018-09-23 DIAGNOSIS — I10 Essential (primary) hypertension: Secondary | ICD-10-CM | POA: Diagnosis not present

## 2018-09-23 DIAGNOSIS — E785 Hyperlipidemia, unspecified: Secondary | ICD-10-CM | POA: Diagnosis not present

## 2018-09-26 DIAGNOSIS — L03115 Cellulitis of right lower limb: Secondary | ICD-10-CM | POA: Diagnosis not present

## 2018-09-26 DIAGNOSIS — S81011D Laceration without foreign body, right knee, subsequent encounter: Secondary | ICD-10-CM | POA: Diagnosis not present

## 2018-10-03 DIAGNOSIS — L609 Nail disorder, unspecified: Secondary | ICD-10-CM | POA: Diagnosis not present

## 2018-10-03 DIAGNOSIS — I739 Peripheral vascular disease, unspecified: Secondary | ICD-10-CM | POA: Diagnosis not present

## 2018-10-03 DIAGNOSIS — L03115 Cellulitis of right lower limb: Secondary | ICD-10-CM | POA: Diagnosis not present

## 2018-10-05 DIAGNOSIS — I10 Essential (primary) hypertension: Secondary | ICD-10-CM | POA: Diagnosis not present

## 2018-10-05 DIAGNOSIS — R42 Dizziness and giddiness: Secondary | ICD-10-CM | POA: Diagnosis not present

## 2018-10-13 DIAGNOSIS — I10 Essential (primary) hypertension: Secondary | ICD-10-CM | POA: Diagnosis not present

## 2018-10-20 DIAGNOSIS — E6609 Other obesity due to excess calories: Secondary | ICD-10-CM | POA: Diagnosis not present

## 2018-10-20 DIAGNOSIS — I10 Essential (primary) hypertension: Secondary | ICD-10-CM | POA: Diagnosis not present

## 2018-10-20 DIAGNOSIS — Z0000 Encounter for general adult medical examination without abnormal findings: Secondary | ICD-10-CM | POA: Diagnosis not present

## 2018-10-20 DIAGNOSIS — R0602 Shortness of breath: Secondary | ICD-10-CM | POA: Diagnosis not present

## 2018-10-20 DIAGNOSIS — R7301 Impaired fasting glucose: Secondary | ICD-10-CM | POA: Diagnosis not present

## 2018-10-20 DIAGNOSIS — E782 Mixed hyperlipidemia: Secondary | ICD-10-CM | POA: Diagnosis not present

## 2018-10-20 DIAGNOSIS — D649 Anemia, unspecified: Secondary | ICD-10-CM | POA: Diagnosis not present

## 2018-10-20 DIAGNOSIS — R42 Dizziness and giddiness: Secondary | ICD-10-CM | POA: Diagnosis not present

## 2018-12-23 DIAGNOSIS — Z0000 Encounter for general adult medical examination without abnormal findings: Secondary | ICD-10-CM | POA: Diagnosis not present

## 2019-01-26 DIAGNOSIS — X32XXXA Exposure to sunlight, initial encounter: Secondary | ICD-10-CM | POA: Diagnosis not present

## 2019-01-26 DIAGNOSIS — L570 Actinic keratosis: Secondary | ICD-10-CM | POA: Diagnosis not present

## 2019-01-26 DIAGNOSIS — Z08 Encounter for follow-up examination after completed treatment for malignant neoplasm: Secondary | ICD-10-CM | POA: Diagnosis not present

## 2019-01-26 DIAGNOSIS — D485 Neoplasm of uncertain behavior of skin: Secondary | ICD-10-CM | POA: Diagnosis not present

## 2019-01-26 DIAGNOSIS — D0439 Carcinoma in situ of skin of other parts of face: Secondary | ICD-10-CM | POA: Diagnosis not present

## 2019-01-26 DIAGNOSIS — Z85828 Personal history of other malignant neoplasm of skin: Secondary | ICD-10-CM | POA: Diagnosis not present

## 2019-01-26 DIAGNOSIS — B078 Other viral warts: Secondary | ICD-10-CM | POA: Diagnosis not present

## 2019-08-28 DIAGNOSIS — D0439 Carcinoma in situ of skin of other parts of face: Secondary | ICD-10-CM | POA: Diagnosis not present

## 2019-08-28 DIAGNOSIS — D485 Neoplasm of uncertain behavior of skin: Secondary | ICD-10-CM | POA: Diagnosis not present

## 2019-08-28 DIAGNOSIS — Z85828 Personal history of other malignant neoplasm of skin: Secondary | ICD-10-CM | POA: Diagnosis not present

## 2019-08-28 DIAGNOSIS — Z08 Encounter for follow-up examination after completed treatment for malignant neoplasm: Secondary | ICD-10-CM | POA: Diagnosis not present

## 2019-08-28 DIAGNOSIS — L821 Other seborrheic keratosis: Secondary | ICD-10-CM | POA: Diagnosis not present

## 2019-08-28 DIAGNOSIS — L918 Other hypertrophic disorders of the skin: Secondary | ICD-10-CM | POA: Diagnosis not present

## 2019-09-25 DIAGNOSIS — R1084 Generalized abdominal pain: Secondary | ICD-10-CM | POA: Diagnosis not present

## 2019-09-25 DIAGNOSIS — I10 Essential (primary) hypertension: Secondary | ICD-10-CM | POA: Diagnosis not present

## 2019-09-25 DIAGNOSIS — D649 Anemia, unspecified: Secondary | ICD-10-CM | POA: Diagnosis not present

## 2019-09-25 DIAGNOSIS — R7301 Impaired fasting glucose: Secondary | ICD-10-CM | POA: Diagnosis not present

## 2019-09-25 DIAGNOSIS — E782 Mixed hyperlipidemia: Secondary | ICD-10-CM | POA: Diagnosis not present

## 2019-09-26 ENCOUNTER — Other Ambulatory Visit (HOSPITAL_COMMUNITY): Payer: Self-pay | Admitting: Internal Medicine

## 2019-09-26 ENCOUNTER — Other Ambulatory Visit: Payer: Self-pay | Admitting: Internal Medicine

## 2019-09-26 DIAGNOSIS — R1084 Generalized abdominal pain: Secondary | ICD-10-CM

## 2019-10-11 ENCOUNTER — Ambulatory Visit (HOSPITAL_COMMUNITY)
Admission: RE | Admit: 2019-10-11 | Discharge: 2019-10-11 | Disposition: A | Discharge status: Home / Self Care | Payer: Medicare HMO | Source: Ambulatory Visit | Attending: Internal Medicine | Admitting: Internal Medicine

## 2019-10-11 ENCOUNTER — Other Ambulatory Visit: Payer: Self-pay

## 2019-10-11 DIAGNOSIS — R1084 Generalized abdominal pain: Secondary | ICD-10-CM

## 2019-10-26 ENCOUNTER — Other Ambulatory Visit (HOSPITAL_COMMUNITY): Payer: Self-pay | Admitting: Internal Medicine

## 2019-10-26 ENCOUNTER — Other Ambulatory Visit: Payer: Self-pay

## 2019-10-26 ENCOUNTER — Ambulatory Visit (HOSPITAL_COMMUNITY)
Admission: RE | Admit: 2019-10-26 | Discharge: 2019-10-26 | Disposition: A | Discharge status: Home / Self Care | Payer: Medicare HMO | Source: Ambulatory Visit | Attending: Internal Medicine | Admitting: Internal Medicine

## 2019-10-26 DIAGNOSIS — R1084 Generalized abdominal pain: Secondary | ICD-10-CM | POA: Diagnosis not present

## 2019-10-26 DIAGNOSIS — K8020 Calculus of gallbladder without cholecystitis without obstruction: Secondary | ICD-10-CM | POA: Diagnosis not present

## 2019-10-26 DIAGNOSIS — K5730 Diverticulosis of large intestine without perforation or abscess without bleeding: Secondary | ICD-10-CM | POA: Diagnosis not present

## 2020-02-29 DIAGNOSIS — L570 Actinic keratosis: Secondary | ICD-10-CM | POA: Diagnosis not present

## 2020-02-29 DIAGNOSIS — R208 Other disturbances of skin sensation: Secondary | ICD-10-CM | POA: Diagnosis not present

## 2020-02-29 DIAGNOSIS — D485 Neoplasm of uncertain behavior of skin: Secondary | ICD-10-CM | POA: Diagnosis not present

## 2020-02-29 DIAGNOSIS — X32XXXA Exposure to sunlight, initial encounter: Secondary | ICD-10-CM | POA: Diagnosis not present

## 2020-02-29 DIAGNOSIS — L923 Foreign body granuloma of the skin and subcutaneous tissue: Secondary | ICD-10-CM | POA: Diagnosis not present

## 2020-02-29 DIAGNOSIS — Z08 Encounter for follow-up examination after completed treatment for malignant neoplasm: Secondary | ICD-10-CM | POA: Diagnosis not present

## 2020-02-29 DIAGNOSIS — L821 Other seborrheic keratosis: Secondary | ICD-10-CM | POA: Diagnosis not present

## 2020-02-29 DIAGNOSIS — Z85828 Personal history of other malignant neoplasm of skin: Secondary | ICD-10-CM | POA: Diagnosis not present

## 2020-02-29 DIAGNOSIS — L820 Inflamed seborrheic keratosis: Secondary | ICD-10-CM | POA: Diagnosis not present

## 2020-02-29 DIAGNOSIS — L538 Other specified erythematous conditions: Secondary | ICD-10-CM | POA: Diagnosis not present

## 2020-03-28 DIAGNOSIS — M25561 Pain in right knee: Secondary | ICD-10-CM | POA: Diagnosis not present

## 2020-03-28 DIAGNOSIS — M1711 Unilateral primary osteoarthritis, right knee: Secondary | ICD-10-CM | POA: Diagnosis not present

## 2020-04-24 DIAGNOSIS — Z008 Encounter for other general examination: Secondary | ICD-10-CM | POA: Diagnosis not present

## 2020-04-24 DIAGNOSIS — Z87891 Personal history of nicotine dependence: Secondary | ICD-10-CM | POA: Diagnosis not present

## 2020-04-24 DIAGNOSIS — G3184 Mild cognitive impairment, so stated: Secondary | ICD-10-CM | POA: Diagnosis not present

## 2020-04-24 DIAGNOSIS — I10 Essential (primary) hypertension: Secondary | ICD-10-CM | POA: Diagnosis not present

## 2020-04-24 DIAGNOSIS — Z6841 Body Mass Index (BMI) 40.0 and over, adult: Secondary | ICD-10-CM | POA: Diagnosis not present

## 2020-05-09 ENCOUNTER — Other Ambulatory Visit: Payer: Self-pay

## 2020-05-09 ENCOUNTER — Ambulatory Visit (INDEPENDENT_AMBULATORY_CARE_PROVIDER_SITE_OTHER): Payer: Medicare HMO | Admitting: Internal Medicine

## 2020-05-09 DIAGNOSIS — Z125 Encounter for screening for malignant neoplasm of prostate: Secondary | ICD-10-CM

## 2020-05-09 DIAGNOSIS — I10 Essential (primary) hypertension: Secondary | ICD-10-CM

## 2020-05-09 DIAGNOSIS — E785 Hyperlipidemia, unspecified: Secondary | ICD-10-CM | POA: Diagnosis not present

## 2020-05-09 DIAGNOSIS — Z1329 Encounter for screening for other suspected endocrine disorder: Secondary | ICD-10-CM | POA: Diagnosis not present

## 2020-05-10 LAB — CBC WITH DIFFERENTIAL/PLATELET
Absolute Monocytes: 505 cells/uL (ref 200–950)
Basophils Absolute: 20 cells/uL (ref 0–200)
Basophils Relative: 0.4 %
Eosinophils Absolute: 51 cells/uL (ref 15–500)
Eosinophils Relative: 1 %
HCT: 39.3 % (ref 38.5–50.0)
Hemoglobin: 13.4 g/dL (ref 13.2–17.1)
Lymphs Abs: 1709 cells/uL (ref 850–3900)
MCH: 32.2 pg (ref 27.0–33.0)
MCHC: 34.1 g/dL (ref 32.0–36.0)
MCV: 94.5 fL (ref 80.0–100.0)
MPV: 11.8 fL (ref 7.5–12.5)
Monocytes Relative: 9.9 %
Neutro Abs: 2815 cells/uL (ref 1500–7800)
Neutrophils Relative %: 55.2 %
Platelets: 183 10*3/uL (ref 140–400)
RBC: 4.16 10*6/uL — ABNORMAL LOW (ref 4.20–5.80)
RDW: 16.6 % — ABNORMAL HIGH (ref 11.0–15.0)
Total Lymphocyte: 33.5 %
WBC: 5.1 10*3/uL (ref 3.8–10.8)

## 2020-05-10 LAB — COMPLETE METABOLIC PANEL WITH GFR
AG Ratio: 1.3 (calc) (ref 1.0–2.5)
ALT: 12 U/L (ref 9–46)
AST: 19 U/L (ref 10–35)
Albumin: 3.7 g/dL (ref 3.6–5.1)
Alkaline phosphatase (APISO): 24 U/L — ABNORMAL LOW (ref 35–144)
BUN: 18 mg/dL (ref 7–25)
CO2: 24 mmol/L (ref 20–32)
Calcium: 8.9 mg/dL (ref 8.6–10.3)
Chloride: 103 mmol/L (ref 98–110)
Creat: 1.05 mg/dL (ref 0.70–1.11)
GFR, Est African American: 74 mL/min/{1.73_m2} (ref >=60)
GFR, Est Non African American: 64 mL/min/{1.73_m2} (ref >=60)
Globulin: 2.8 g/dL (calc) (ref 1.9–3.7)
Glucose, Bld: 98 mg/dL (ref 65–99)
Potassium: 4.3 mmol/L (ref 3.5–5.3)
Sodium: 137 mmol/L (ref 135–146)
Total Bilirubin: 0.5 mg/dL (ref 0.2–1.2)
Total Protein: 6.5 g/dL (ref 6.1–8.1)

## 2020-05-10 LAB — LIPID PANEL
Cholesterol: 202 mg/dL — ABNORMAL HIGH (ref <200)
HDL: 35 mg/dL — ABNORMAL LOW (ref >=40)
LDL Cholesterol (Calc): 143 mg/dL (calc) — ABNORMAL HIGH
Non-HDL Cholesterol (Calc): 167 mg/dL (calc) — ABNORMAL HIGH (ref <130)
Total CHOL/HDL Ratio: 5.8 (calc) — ABNORMAL HIGH (ref <5.0)
Triglycerides: 118 mg/dL (ref <150)

## 2020-05-10 LAB — PSA: PSA: 0.3 ng/mL (ref <=4.0)

## 2020-05-10 LAB — TSH: TSH: 1.43 mIU/L (ref 0.40–4.50)

## 2020-05-16 ENCOUNTER — Ambulatory Visit: Payer: Medicare HMO | Admitting: Internal Medicine

## 2020-05-16 ENCOUNTER — Ambulatory Visit (INDEPENDENT_AMBULATORY_CARE_PROVIDER_SITE_OTHER): Payer: Medicare HMO | Admitting: Internal Medicine

## 2020-05-16 ENCOUNTER — Encounter: Payer: Self-pay | Admitting: Internal Medicine

## 2020-05-16 ENCOUNTER — Other Ambulatory Visit: Payer: Self-pay

## 2020-05-16 VITALS — BP 187/118 | HR 55 | Ht 69.0 in | Wt 267.1 lb

## 2020-05-16 DIAGNOSIS — I10 Essential (primary) hypertension: Secondary | ICD-10-CM

## 2020-05-16 DIAGNOSIS — E785 Hyperlipidemia, unspecified: Secondary | ICD-10-CM | POA: Diagnosis not present

## 2020-05-16 DIAGNOSIS — Z0000 Encounter for general adult medical examination without abnormal findings: Secondary | ICD-10-CM | POA: Diagnosis not present

## 2020-05-16 DIAGNOSIS — Z6839 Body mass index (BMI) 39.0-39.9, adult: Secondary | ICD-10-CM

## 2020-05-16 MED ORDER — AMLODIPINE BESYLATE 5 MG PO TABS: 5.0000 mg | ORAL_TABLET | Freq: Every day | ORAL | 3 refills | Status: DC

## 2020-05-16 NOTE — Assessment & Plan Note (Signed)
-   I encouraged the patient to lose weight.  - I educated them on making healthy dietary choices including eating more fruits and vegetables and less fried foods. - I encouraged the patient to exercise more, and educated on the benefits of exercise including weight loss, diabetes prevention, and hypertension management.   

## 2020-05-16 NOTE — Assessment & Plan Note (Signed)
-   Today, the patient's blood pressure is not well managed on . - The patient will change the current treatment regimen   And add norvasc 5 mg po daily.  - I encouraged the patient to eat a low-sodium diet to help control blood pressure. - I encouraged the patient to live an active lifestyle and complete activities that increases heart rate to 85% target heart rate at least 5 times per week for one hour.

## 2020-05-16 NOTE — Progress Notes (Signed)
Established Patient Office Visit  SUBJECTIVE:  Subjective  Patient ID: Tommy Barton, male    DOB: [birth year]  Age: 86 y.o. MRN: [id number]  CC:  Chief Complaint  Patient presents with  . Annual Exam  . Hypertension    patient's BP elevated and he complains of having headaches     HPI Tommy Barton is a 86 y.o. male presenting today for an annual exam.   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 He has been well overall. He notes that he will occasionally get dizzy, but he attributes that with getting hot while working.   His blood pressure today is 187/118. He has been taking his medication without difficulty. He declines missed doses.   Labs were drawn on 05/09/2020. CBC was normal except for RBC 4.16 and RDW 16.6. CMP was normal except for Alk phos 24. Lipid panel was abnormal with Cholesterol 202, HDL 35, Triglycerides 118, LDL 143, CHOL/HDL 5.8, and Non-HDL Chol 167. TSH was normal at 1.43. PSA was normal at 0.3.   Past Medical History:  Diagnosis Date  . Hypertension     History reviewed. No pertinent surgical history.  History reviewed. No pertinent family history.  Social History   Socioeconomic History  . Marital status: Married    Spouse name: Not on file  . Number of children: Not on file  . Years of education: Not on file  . Highest education level: Not on file  Occupational History  . Not on file  Tobacco Use  . Smoking status: Never Smoker  . Smokeless tobacco: Never Used  Substance and Sexual Activity  . Alcohol use: No  . Drug use: No  . Sexual activity: Not on file  Other Topics Concern  . Not on file  Social History Narrative  . Not on file   Social Determinants of Health   Financial Resource Strain:   . Difficulty of Paying Living Expenses: Not on file  Food Insecurity:   . Worried About Charity fundraiser in the Last Year: Not on file  . Ran Out of Food in the Last Year: Not on file  Transportation Needs:    . Lack of Transportation (Medical): Not on file  . Lack of Transportation (Non-Medical): Not on file  Physical Activity:   . Days of Exercise per Week: Not on file  . Minutes of Exercise per Session: Not on file  Stress:   . Feeling of Stress : Not on file  Social Connections:   . Frequency of Communication with Friends and Family: Not on file  . Frequency of Social Gatherings with Friends and Family: Not on file  . Attends Religious Services: Not on file  . Active Member of Clubs or Organizations: Not on file  . Attends Archivist Meetings: Not on file  . Marital Status: Not on file  Intimate Partner Violence:   . Fear of Current or Ex-Partner: Not on file  . Emotionally Abused: Not on file  . Physically Abused: Not on file  . Sexually Abused: Not on file     Current Outpatient Medications:  .  atenolol (TENORMIN) 50 MG tablet, Take 1 tablet by mouth daily., Disp: , Rfl:  .  diclofenac (VOLTAREN) 75 MG EC tablet, Take 1 tablet (75 mg total) by mouth 2 (two) times daily as needed., Disp: 20 tablet, Rfl: 0 .  doxycycline (DORYX) 100 MG EC tablet, Take 100 mg by mouth 2 (two)  times daily. Take x 10 days, per patient started med a week ago, Disp: , Rfl:  .  enalapril (VASOTEC) 20 MG tablet, Take 1 tablet by mouth 2 (two) times daily. , Disp: , Rfl:  .  hydrochlorothiazide (HYDRODIURIL) 25 MG tablet, Take 1 tablet by mouth daily., Disp: , Rfl:  .  lovastatin (MEVACOR) 40 MG tablet, Take 1 tablet by mouth daily., Disp: , Rfl:  .  amLODipine (NORVASC) 5 MG tablet, Take 1 tablet (5 mg total) by mouth daily., Disp: 90 tablet, Rfl: 3   No Known Allergies  ROS Review of Systems  Constitutional: Negative.   HENT: Negative.   Eyes: Negative.   Respiratory: Negative.   Cardiovascular: Negative.   Gastrointestinal: Negative.   Endocrine: Negative.   Genitourinary: Negative.   Musculoskeletal: Negative.   Skin: Negative.   Allergic/Immunologic: Negative.   Neurological:  Positive for dizziness.  Hematological: Negative.   Psychiatric/Behavioral: Negative.   All other systems reviewed and are negative.    OBJECTIVE:    Physical Exam Vitals reviewed.  Constitutional:      Appearance: Normal appearance.  HENT:     Mouth/Throat:     Mouth: Mucous membranes are moist.  Eyes:     Pupils: Pupils are equal, round, and reactive to light.  Neck:     Vascular: No carotid bruit.  Cardiovascular:     Rate and Rhythm: Normal rate and regular rhythm.     Pulses: Normal pulses.     Heart sounds: Normal heart sounds.  Pulmonary:     Effort: Pulmonary effort is normal.     Breath sounds: Normal breath sounds.  Abdominal:     General: Bowel sounds are normal.     Palpations: Abdomen is soft. There is no hepatomegaly, splenomegaly or mass.     Tenderness: There is no abdominal tenderness.     Hernia: No hernia is present.  Genitourinary:    Prostate: Normal. Not enlarged, not tender and no nodules present.     Rectum: Normal. No mass or tenderness.  Musculoskeletal:     Cervical back: Neck supple.     Right knee: Swelling present.     Right lower leg: 1+ Edema present.     Left lower leg: 1+ Edema present.  Skin:    Findings: No rash.  Neurological:     Mental Status: He is alert and oriented to person, place, and time.     Motor: No weakness.  Psychiatric:        Mood and Affect: Mood normal.        Behavior: Behavior normal.     BP (!) 187/118   Pulse (!) 55   Ht $R'5\' 9"'rD$  (1.753 m)   Wt 267 lb 1.6 oz (121.2 kg)   BMI 39.44 kg/m  Wt Readings from Last 3 Encounters:  05/16/20 267 lb 1.6 oz (121.2 kg)  12/26/16 280 lb (127 kg)  01/17/12 270 lb (122.5 kg)    Health Maintenance Due  Topic Date Due  . COVID-19 Vaccine (1) Never done  . TETANUS/TDAP  Never done  . PNA vac Low Risk Adult (1 of 2 - PCV13) Never done  . INFLUENZA VACCINE  Never done    There are no preventive care reminders to display for this patient.  CBC Latest Ref Rng &  Units 05/09/2020  WBC 3.8 - 10.8 Thousand/uL 5.1  Hemoglobin 13.2 - 17.1 g/dL 13.4  Hematocrit 38 - 50 % 39.3  Platelets 140 - 400 Thousand/uL  183   CMP Latest Ref Rng & Units 05/09/2020  Glucose 65 - 99 mg/dL 98  BUN 7 - 25 mg/dL 18  Creatinine 0.70 - 1.11 mg/dL 1.05  Sodium 135 - 146 mmol/L 137  Potassium 3.5 - 5.3 mmol/L 4.3  Chloride 98 - 110 mmol/L 103  CO2 20 - 32 mmol/L 24  Calcium 8.6 - 10.3 mg/dL 8.9  Total Protein 6.1 - 8.1 g/dL 6.5  Total Bilirubin 0.2 - 1.2 mg/dL 0.5  AST 10 - 35 U/L 19  ALT 9 - 46 U/L 12    Lab Results  Component Value Date   TSH 1.43 05/09/2020   No results found for: ALBUMIN, ANIONGAP, EGFR, GFR Lab Results  Component Value Date   CHOL 202 (H) 05/09/2020   HDL 35 (L) 05/09/2020   LDLCALC 143 (H) 05/09/2020   CHOLHDL 5.8 (H) 05/09/2020   Lab Results  Component Value Date   TRIG 118 05/09/2020   No results found for: HGBA1C    ASSESSMENT & PLAN:   Problem List Items Addressed This Visit      Cardiovascular and Mediastinum   Hypertension    - Today, the patient's blood pressure is not well managed on . - The patient will change the current treatment regimen   And add norvasc 5 mg po daily.  - I encouraged the patient to eat a low-sodium diet to help control blood pressure. - I encouraged the patient to live an active lifestyle and complete activities that increases heart rate to 85% target heart rate at least 5 times per week for one hour.          Relevant Medications   amLODipine (NORVASC) 5 MG tablet     Other   Annual physical exam    Pt's main problem is obesity and he was advised to lose weight. He also complained of arthritis of the right knee and he was referred to orthopedics for that. His prostate exam was normal. His blood pressure is elevated today so I added Norvasc 5 mg in addition to his previous treatmen regimen       Class 2 severe obesity due to excess calories with serious comorbidity and body mass index (BMI)  of 39.0 to 39.9 in adult [facility])    - I encouraged the patient to lose weight.  - I educated them on making healthy dietary choices including eating more fruits and vegetables and less fried foods. - I encouraged the patient to exercise more, and educated on the benefits of exercise including weight loss, diabetes prevention, and hypertension management.        Hyperlipidemia - Primary    - The patient's hyperlipidemia is stable on lovastatin 63m. - The patient will continue the current treatment regimen.  - I encouraged the patient to eat more vegetables and whole wheat, and to avoid fatty foods like whole milk, hard cheese, egg yolks, margarine, baked sweets, and fried foods.  - I encouraged the patient to live an active lifestyle and complete activities for 40 minutes at least three times per week.  - I instructed the patient to go to the ER if they begin having chest pain.       Relevant Medications   amLODipine (NORVASC) 5 MG tablet      Meds ordered this encounter  Medications  . amLODipine (NORVASC) 5 MG tablet    Sig: Take 1 tablet (5 mg total) by mouth daily.    Dispense:  90 tablet  Refill:  3    Follow-up: No follow-ups on file.    Cletis Athens, MD [facility] [address] [place], [place] [address]   By signing my name below, I, [employer], attest that this documentation has been prepared under the direction and in the presence of Dr. Cletis Athens Electronically Signed: Cletis Athens, MD 05/16/20, 10:14 AM  I personally performed the services described in this documentation, which was SCRIBED in my presence. The recorded information has been reviewed and considered accurate. It has been edited as necessary during review. Cletis Athens, MD

## 2020-05-16 NOTE — Assessment & Plan Note (Signed)
Pt's main problem is obesity and he was advised to lose weight. He also complained of arthritis of the right knee and he was referred to orthopedics for that. His prostate exam was normal. His blood pressure is elevated today so I added Norvasc 5 mg in addition to his previous treatmen regimen

## 2020-05-16 NOTE — Assessment & Plan Note (Signed)
-   The patient's hyperlipidemia is stable on lovastatin 40mg . - The patient will continue the current treatment regimen.  - I encouraged the patient to eat more vegetables and whole wheat, and to avoid fatty foods like whole milk, hard cheese, egg yolks, margarine, baked sweets, and fried foods.  - I encouraged the patient to live an active lifestyle and complete activities for 40 minutes at least three times per week.  - I instructed the patient to go to the ER if they begin having chest pain.

## 2020-07-31 ENCOUNTER — Other Ambulatory Visit: Payer: Self-pay | Admitting: Internal Medicine

## 2020-10-28 ENCOUNTER — Other Ambulatory Visit: Payer: Self-pay | Admitting: Internal Medicine

## 2020-11-08 DIAGNOSIS — D2262 Melanocytic nevi of left upper limb, including shoulder: Secondary | ICD-10-CM | POA: Diagnosis not present

## 2020-11-08 DIAGNOSIS — D225 Melanocytic nevi of trunk: Secondary | ICD-10-CM | POA: Diagnosis not present

## 2020-11-08 DIAGNOSIS — L821 Other seborrheic keratosis: Secondary | ICD-10-CM | POA: Diagnosis not present

## 2020-11-08 DIAGNOSIS — L7211 Pilar cyst: Secondary | ICD-10-CM | POA: Diagnosis not present

## 2020-11-08 DIAGNOSIS — Z85828 Personal history of other malignant neoplasm of skin: Secondary | ICD-10-CM | POA: Diagnosis not present

## 2020-11-08 DIAGNOSIS — D2271 Melanocytic nevi of right lower limb, including hip: Secondary | ICD-10-CM | POA: Diagnosis not present

## 2020-11-08 DIAGNOSIS — D2261 Melanocytic nevi of right upper limb, including shoulder: Secondary | ICD-10-CM | POA: Diagnosis not present

## 2021-01-20 ENCOUNTER — Other Ambulatory Visit: Payer: Self-pay

## 2021-01-20 ENCOUNTER — Other Ambulatory Visit: Payer: Medicare HMO

## 2021-01-20 ENCOUNTER — Other Ambulatory Visit (INDEPENDENT_AMBULATORY_CARE_PROVIDER_SITE_OTHER): Payer: Medicare HMO

## 2021-01-20 DIAGNOSIS — I152 Hypertension secondary to endocrine disorders: Secondary | ICD-10-CM

## 2021-01-20 DIAGNOSIS — Z6839 Body mass index (BMI) 39.0-39.9, adult: Secondary | ICD-10-CM | POA: Diagnosis not present

## 2021-01-20 DIAGNOSIS — Z0000 Encounter for general adult medical examination without abnormal findings: Secondary | ICD-10-CM

## 2021-01-20 DIAGNOSIS — E785 Hyperlipidemia, unspecified: Secondary | ICD-10-CM | POA: Diagnosis not present

## 2021-01-28 NOTE — Assessment & Plan Note (Signed)
Hypercholesterolemia  I advised the patient to follow Mediterranean diet This diet is rich in fruits vegetables and whole grain, and This diet is also rich in fish and lean meat Patient should also eat a handful of almonds or walnuts daily Recent heart study indicated that average follow-up on this kind of diet reduces the cardiovascular mortality by 50 to 70%== 

## 2021-01-28 NOTE — Assessment & Plan Note (Signed)
Blood pressure is stable on the present medication 

## 2021-01-28 NOTE — Assessment & Plan Note (Signed)

## 2021-01-28 NOTE — Assessment & Plan Note (Addendum)
Physical exam is normal.

## 2021-01-28 NOTE — Progress Notes (Signed)
Established Patient Office Visit  Subjective:  Patient ID: Tommy Barton, male    DOB: 1932-04-19  Age: 87 y.o. MRN: [id number]  CC: No chief complaint on file.   HPI  Tommy Barton presents for blood pressure checkup.  He denies any chest pain or shortness of breath.  Past Medical History:  Diagnosis Date  . Hypertension     History reviewed. No pertinent surgical history.  History reviewed. No pertinent family history.  Social History   Socioeconomic History  . Marital status: Married    Spouse name: Not on file  . Number of children: Not on file  . Years of education: Not on file  . Highest education level: Not on file  Occupational History  . Not on file  Tobacco Use  . Smoking status: Never Smoker  . Smokeless tobacco: Never Used  Substance and Sexual Activity  . Alcohol use: No  . Drug use: No  . Sexual activity: Not on file  Other Topics Concern  . Not on file  Social History Narrative  . Not on file   Social Determinants of Health   Financial Resource Strain: Not on file  Food Insecurity: Not on file  Transportation Needs: Not on file  Physical Activity: Not on file  Stress: Not on file  Social Connections: Not on file  Intimate Partner Violence: Not on file     Current Outpatient Medications:  .  amLODipine (NORVASC) 5 MG tablet, Take 1 tablet (5 mg total) by mouth daily., Disp: 90 tablet, Rfl: 3 .  atenolol (TENORMIN) 50 MG tablet, Take 1 tablet by mouth once daily, Disp: 90 tablet, Rfl: 0 .  diclofenac (VOLTAREN) 75 MG EC tablet, Take 1 tablet (75 mg total) by mouth 2 (two) times daily as needed., Disp: 20 tablet, Rfl: 0 .  doxycycline (DORYX) 100 MG EC tablet, Take 100 mg by mouth 2 (two) times daily. Take x 10 days, per patient started med a week ago, Disp: , Rfl:  .  enalapril (VASOTEC) 20 MG tablet, Take 2 tablets by mouth once daily, Disp: 180 tablet, Rfl: 0 .  hydrochlorothiazide (HYDRODIURIL) 25 MG tablet, Take 1 tablet by mouth  daily., Disp: , Rfl:  .  lovastatin (MEVACOR) 40 MG tablet, Take 1 tablet by mouth daily., Disp: , Rfl:    No Known Allergies  ROS Review of Systems  Constitutional: Negative.   HENT: Negative.   Eyes: Negative.   Respiratory: Negative.   Cardiovascular: Negative.   Gastrointestinal: Negative.   Endocrine: Negative.   Genitourinary: Negative.   Musculoskeletal: Negative.   Skin: Negative.   Allergic/Immunologic: Negative.   Neurological: Negative.   Hematological: Negative.   Psychiatric/Behavioral: Negative.   All other systems reviewed and are negative.     Objective:    Physical Exam Vitals reviewed.  Constitutional:      Appearance: Normal appearance.  HENT:     Mouth/Throat:     Mouth: Mucous membranes are moist.  Eyes:     Pupils: Pupils are equal, round, and reactive to light.  Neck:     Vascular: No carotid bruit.  Cardiovascular:     Rate and Rhythm: Normal rate and regular rhythm.     Pulses: Normal pulses.     Heart sounds: Normal heart sounds.  Pulmonary:     Effort: Pulmonary effort is normal.     Breath sounds: Normal breath sounds.  Abdominal:     General: Bowel sounds are normal.  Palpations: Abdomen is soft. There is no hepatomegaly, splenomegaly or mass.     Tenderness: There is no abdominal tenderness.     Hernia: No hernia is present.  Musculoskeletal:     Cervical back: Neck supple.     Right lower leg: No edema.     Left lower leg: No edema.  Skin:    Findings: No rash.  Neurological:     Mental Status: He is alert and oriented to person, place, and time.     Motor: No weakness.  Psychiatric:        Mood and Affect: Mood normal.        Behavior: Behavior normal.     There were no vitals taken for this visit. Wt Readings from Last 3 Encounters:  05/16/20 267 lb 1.6 oz (121.2 kg)  12/26/16 280 lb (127 kg)  01/17/12 270 lb (122.5 kg)     Health Maintenance Due  Topic Date Due  . COVID-19 Vaccine (1) Never done  .  TETANUS/TDAP  Never done  . Zoster Vaccines- Shingrix (1 of 2) Never done  . PNA vac Low Risk Adult (1 of 2 - PCV13) Never done    There are no preventive care reminders to display for this patient.  Lab Results  Component Value Date   TSH 1.43 05/09/2020   Lab Results  Component Value Date   WBC 5.1 05/09/2020   HGB 13.4 05/09/2020   HCT 39.3 05/09/2020   MCV 94.5 05/09/2020   PLT 183 05/09/2020   Lab Results  Component Value Date   NA 137 05/09/2020   K 4.3 05/09/2020   CO2 24 05/09/2020   GLUCOSE 98 05/09/2020   BUN 18 05/09/2020   CREATININE 1.05 05/09/2020   BILITOT 0.5 05/09/2020   AST 19 05/09/2020   ALT 12 05/09/2020   PROT 6.5 05/09/2020   CALCIUM 8.9 05/09/2020   Lab Results  Component Value Date   CHOL 202 (H) 05/09/2020   Lab Results  Component Value Date   HDL 35 (L) 05/09/2020   Lab Results  Component Value Date   LDLCALC 143 (H) 05/09/2020   Lab Results  Component Value Date   TRIG 118 05/09/2020   Lab Results  Component Value Date   CHOLHDL 5.8 (H) 05/09/2020   No results found for: HGBA1C    Assessment & Plan:   Problem List Items Addressed This Visit      Cardiovascular and Mediastinum   Hypertension - Primary    Blood pressure is stable on the present medication.        Other   Annual physical exam    Physical exam is normal.       Class 2 severe obesity due to excess calories with serious comorbidity and body mass index (BMI) of 39.0 to 39.9 in adult [facility])    - I encouraged the patient to lose weight.  - I educated them on making healthy dietary choices including eating more fruits and vegetables and less fried foods. - I encouraged the patient to exercise more, and educated on the benefits of exercise including weight loss, diabetes prevention, and hypertension prevention.   Dietary counseling with a registered dietician  Referral to a weight management support group (e.g. Weight Watchers, Overeaters Anonymous)  If  your BMI is greater than 29 or you have gained more than 15 pounds you should work on weight loss.  Attend a healthy cooking class       Hyperlipidemia  Hypercholesterolemia  I advised the patient to follow Mediterranean diet This diet is rich in fruits vegetables and whole grain, and This diet is also rich in fish and lean meat Patient should also eat a handful of almonds or walnuts daily Recent heart study indicated that average follow-up on this kind of diet reduces the cardiovascular mortality by 50 to 70%==         No orders of the defined types were placed in this encounter.   Follow-up: No follow-ups on file.    Cletis Athens, MD

## 2021-01-30 DIAGNOSIS — R7301 Impaired fasting glucose: Secondary | ICD-10-CM | POA: Diagnosis not present

## 2021-01-30 DIAGNOSIS — R600 Localized edema: Secondary | ICD-10-CM | POA: Diagnosis not present

## 2021-01-30 DIAGNOSIS — R011 Cardiac murmur, unspecified: Secondary | ICD-10-CM | POA: Diagnosis not present

## 2021-01-30 DIAGNOSIS — Z125 Encounter for screening for malignant neoplasm of prostate: Secondary | ICD-10-CM | POA: Diagnosis not present

## 2021-01-30 DIAGNOSIS — I10 Essential (primary) hypertension: Secondary | ICD-10-CM | POA: Diagnosis not present

## 2021-02-01 ENCOUNTER — Other Ambulatory Visit: Payer: Self-pay | Admitting: Internal Medicine

## 2021-02-10 DIAGNOSIS — D649 Anemia, unspecified: Secondary | ICD-10-CM | POA: Diagnosis not present

## 2021-02-10 DIAGNOSIS — R7301 Impaired fasting glucose: Secondary | ICD-10-CM | POA: Diagnosis not present

## 2021-02-10 DIAGNOSIS — R7303 Prediabetes: Secondary | ICD-10-CM | POA: Diagnosis not present

## 2021-02-10 DIAGNOSIS — N289 Disorder of kidney and ureter, unspecified: Secondary | ICD-10-CM | POA: Diagnosis not present

## 2021-02-10 DIAGNOSIS — E785 Hyperlipidemia, unspecified: Secondary | ICD-10-CM | POA: Diagnosis not present

## 2021-02-10 DIAGNOSIS — R011 Cardiac murmur, unspecified: Secondary | ICD-10-CM | POA: Diagnosis not present

## 2021-03-27 ENCOUNTER — Other Ambulatory Visit (HOSPITAL_COMMUNITY): Payer: Self-pay | Admitting: Family Medicine

## 2021-03-27 DIAGNOSIS — R011 Cardiac murmur, unspecified: Secondary | ICD-10-CM

## 2021-04-11 ENCOUNTER — Other Ambulatory Visit: Payer: Self-pay

## 2021-04-11 ENCOUNTER — Ambulatory Visit (HOSPITAL_COMMUNITY)
Admission: RE | Admit: 2021-04-11 | Discharge: 2021-04-11 | Disposition: A | Discharge status: Home / Self Care | Payer: Medicare HMO | Source: Ambulatory Visit | Attending: Family Medicine | Admitting: Family Medicine

## 2021-04-11 DIAGNOSIS — R011 Cardiac murmur, unspecified: Secondary | ICD-10-CM | POA: Diagnosis not present

## 2021-04-11 LAB — ECHOCARDIOGRAM COMPLETE
AV Mean grad: 10.5 mmHg
AV Peak grad: 19.7 mmHg
Ao pk vel: 2.22 m/s
Area-P 1/2: 3.85 cm2
S' Lateral: 2.15 cm

## 2021-04-11 NOTE — Progress Notes (Signed)
*  PRELIMINARY RESULTS* Echocardiogram 2D Echocardiogram has been performed.  Tommy Barton 04/11/2021, 2:38 PM

## 2021-04-30 DIAGNOSIS — I10 Essential (primary) hypertension: Secondary | ICD-10-CM | POA: Diagnosis not present

## 2021-05-19 ENCOUNTER — Other Ambulatory Visit: Payer: Self-pay | Admitting: Internal Medicine

## 2021-05-19 DIAGNOSIS — I10 Essential (primary) hypertension: Secondary | ICD-10-CM

## 2021-06-04 ENCOUNTER — Ambulatory Visit: Payer: Medicare HMO | Admitting: Cardiology

## 2021-08-07 DIAGNOSIS — I10 Essential (primary) hypertension: Secondary | ICD-10-CM | POA: Diagnosis not present

## 2021-08-07 DIAGNOSIS — R7301 Impaired fasting glucose: Secondary | ICD-10-CM | POA: Diagnosis not present

## 2021-08-07 DIAGNOSIS — E785 Hyperlipidemia, unspecified: Secondary | ICD-10-CM | POA: Diagnosis not present

## 2021-08-12 DIAGNOSIS — N289 Disorder of kidney and ureter, unspecified: Secondary | ICD-10-CM | POA: Diagnosis not present

## 2021-08-12 DIAGNOSIS — E785 Hyperlipidemia, unspecified: Secondary | ICD-10-CM | POA: Diagnosis not present

## 2021-08-12 DIAGNOSIS — R7303 Prediabetes: Secondary | ICD-10-CM | POA: Diagnosis not present

## 2021-08-12 DIAGNOSIS — R7301 Impaired fasting glucose: Secondary | ICD-10-CM | POA: Diagnosis not present

## 2021-08-12 DIAGNOSIS — D649 Anemia, unspecified: Secondary | ICD-10-CM | POA: Diagnosis not present

## 2021-08-12 DIAGNOSIS — I517 Cardiomegaly: Secondary | ICD-10-CM | POA: Diagnosis not present

## 2021-08-12 DIAGNOSIS — I10 Essential (primary) hypertension: Secondary | ICD-10-CM | POA: Diagnosis not present

## 2021-08-15 ENCOUNTER — Other Ambulatory Visit: Payer: Self-pay | Admitting: Internal Medicine

## 2021-08-15 DIAGNOSIS — I10 Essential (primary) hypertension: Secondary | ICD-10-CM

## 2021-08-26 ENCOUNTER — Other Ambulatory Visit: Payer: Self-pay | Admitting: Internal Medicine

## 2021-08-26 DIAGNOSIS — I10 Essential (primary) hypertension: Secondary | ICD-10-CM

## 2021-11-05 DIAGNOSIS — H524 Presbyopia: Secondary | ICD-10-CM | POA: Diagnosis not present

## 2021-11-14 DIAGNOSIS — Z85828 Personal history of other malignant neoplasm of skin: Secondary | ICD-10-CM | POA: Diagnosis not present

## 2021-11-14 DIAGNOSIS — D225 Melanocytic nevi of trunk: Secondary | ICD-10-CM | POA: Diagnosis not present

## 2021-11-14 DIAGNOSIS — D2271 Melanocytic nevi of right lower limb, including hip: Secondary | ICD-10-CM | POA: Diagnosis not present

## 2021-11-14 DIAGNOSIS — D2272 Melanocytic nevi of left lower limb, including hip: Secondary | ICD-10-CM | POA: Diagnosis not present

## 2021-11-14 DIAGNOSIS — L7211 Pilar cyst: Secondary | ICD-10-CM | POA: Diagnosis not present

## 2021-11-14 DIAGNOSIS — L570 Actinic keratosis: Secondary | ICD-10-CM | POA: Diagnosis not present

## 2021-11-14 DIAGNOSIS — L821 Other seborrheic keratosis: Secondary | ICD-10-CM | POA: Diagnosis not present

## 2021-12-08 ENCOUNTER — Other Ambulatory Visit: Payer: Self-pay | Admitting: Internal Medicine

## 2021-12-08 DIAGNOSIS — I10 Essential (primary) hypertension: Secondary | ICD-10-CM

## 2021-12-24 DIAGNOSIS — R0789 Other chest pain: Secondary | ICD-10-CM | POA: Diagnosis not present

## 2021-12-24 DIAGNOSIS — I10 Essential (primary) hypertension: Secondary | ICD-10-CM | POA: Diagnosis not present

## 2021-12-24 DIAGNOSIS — Z6839 Body mass index (BMI) 39.0-39.9, adult: Secondary | ICD-10-CM | POA: Diagnosis not present

## 2022-01-01 DIAGNOSIS — Z6839 Body mass index (BMI) 39.0-39.9, adult: Secondary | ICD-10-CM | POA: Diagnosis not present

## 2022-01-01 DIAGNOSIS — I10 Essential (primary) hypertension: Secondary | ICD-10-CM | POA: Diagnosis not present

## 2022-01-01 DIAGNOSIS — R0789 Other chest pain: Secondary | ICD-10-CM | POA: Diagnosis not present

## 2022-01-06 ENCOUNTER — Ambulatory Visit (HOSPITAL_COMMUNITY)
Admission: RE | Admit: 2022-01-06 | Discharge: 2022-01-06 | Disposition: A | Discharge status: Home / Self Care | Payer: Medicare HMO | Source: Ambulatory Visit | Attending: Family Medicine | Admitting: Family Medicine

## 2022-01-06 ENCOUNTER — Other Ambulatory Visit (HOSPITAL_COMMUNITY): Payer: Self-pay | Admitting: Family Medicine

## 2022-01-06 DIAGNOSIS — R5383 Other fatigue: Secondary | ICD-10-CM | POA: Diagnosis not present

## 2022-01-06 DIAGNOSIS — J069 Acute upper respiratory infection, unspecified: Secondary | ICD-10-CM

## 2022-01-06 DIAGNOSIS — R0602 Shortness of breath: Secondary | ICD-10-CM | POA: Diagnosis not present

## 2022-01-23 ENCOUNTER — Other Ambulatory Visit: Payer: Self-pay

## 2022-01-23 ENCOUNTER — Encounter (HOSPITAL_COMMUNITY): Payer: Self-pay

## 2022-01-23 ENCOUNTER — Emergency Department (HOSPITAL_COMMUNITY)
Admission: EM | Admit: 2022-01-23 | Discharge: 2022-01-24 | Disposition: A | Discharge status: Home / Self Care | Payer: Medicare HMO | Attending: Emergency Medicine | Admitting: Emergency Medicine

## 2022-01-23 ENCOUNTER — Emergency Department (HOSPITAL_COMMUNITY): Payer: Medicare HMO

## 2022-01-23 DIAGNOSIS — K5900 Constipation, unspecified: Secondary | ICD-10-CM | POA: Insufficient documentation

## 2022-01-23 DIAGNOSIS — R109 Unspecified abdominal pain: Secondary | ICD-10-CM | POA: Diagnosis not present

## 2022-01-23 NOTE — ED Triage Notes (Signed)
Pov from home cc of constipation for 3-4 days. Had prune juice around 11. With no luck .

## 2022-01-24 ENCOUNTER — Emergency Department (HOSPITAL_COMMUNITY): Payer: Medicare HMO

## 2022-01-24 DIAGNOSIS — R109 Unspecified abdominal pain: Secondary | ICD-10-CM | POA: Diagnosis not present

## 2022-01-24 LAB — CBC WITH DIFFERENTIAL/PLATELET
Abs Immature Granulocytes: 0.08 10*3/uL — ABNORMAL HIGH (ref 0.00–0.07)
Basophils Absolute: 0 10*3/uL (ref 0.0–0.1)
Basophils Relative: 0 %
Eosinophils Absolute: 0 10*3/uL (ref 0.0–0.5)
Eosinophils Relative: 0 %
HCT: 38 % — ABNORMAL LOW (ref 39.0–52.0)
Hemoglobin: 13 g/dL (ref 13.0–17.0)
Immature Granulocytes: 1 %
Lymphocytes Relative: 13 %
Lymphs Abs: 1.5 10*3/uL (ref 0.7–4.0)
MCH: 31.8 pg (ref 26.0–34.0)
MCHC: 34.2 g/dL (ref 30.0–36.0)
MCV: 92.9 fL (ref 80.0–100.0)
Monocytes Absolute: 0.7 10*3/uL (ref 0.1–1.0)
Monocytes Relative: 6 %
Neutro Abs: 9.4 10*3/uL — ABNORMAL HIGH (ref 1.7–7.7)
Neutrophils Relative %: 80 %
Platelets: 207 10*3/uL (ref 150–400)
RBC: 4.09 MIL/uL — ABNORMAL LOW (ref 4.22–5.81)
RDW: 17.8 % — ABNORMAL HIGH (ref 11.5–15.5)
WBC: 11.7 10*3/uL — ABNORMAL HIGH (ref 4.0–10.5)
nRBC: 0 % (ref 0.0–0.2)

## 2022-01-24 LAB — COMPREHENSIVE METABOLIC PANEL
ALT: 20 U/L (ref 0–44)
AST: 29 U/L (ref 15–41)
Albumin: 4 g/dL (ref 3.5–5.0)
Alkaline Phosphatase: 25 U/L — ABNORMAL LOW (ref 38–126)
Anion gap: 7 (ref 5–15)
BUN: 16 mg/dL (ref 8–23)
CO2: 24 mmol/L (ref 22–32)
Calcium: 8.7 mg/dL — ABNORMAL LOW (ref 8.9–10.3)
Chloride: 101 mmol/L (ref 98–111)
Creatinine, Ser: 1.08 mg/dL (ref 0.61–1.24)
GFR, Estimated: >60 mL/min (ref >60)
Glucose, Bld: 120 mg/dL — ABNORMAL HIGH (ref 70–99)
Potassium: 4.1 mmol/L (ref 3.5–5.1)
Sodium: 132 mmol/L — ABNORMAL LOW (ref 135–145)
Total Bilirubin: 0.3 mg/dL (ref 0.3–1.2)
Total Protein: 7.5 g/dL (ref 6.5–8.1)

## 2022-01-24 MED ORDER — IOHEXOL 300 MG/ML  SOLN
100.0000 mL | Freq: Once | INTRAMUSCULAR | Status: AC | PRN
Start: 2022-01-24 — End: 2022-01-24
  Administered 2022-01-24: 100 mL via INTRAVENOUS

## 2022-01-24 NOTE — ED Provider Notes (Signed)
[facility] EMERGENCY DEPARTMENT Provider Note   CSN: [id number] Arrival date & time: 01/23/22  1936     History  Chief Complaint  Patient presents with   Constipation    Tommy Barton is a 88 y.o. male.  Patient presents to the emergency department stating that he has felt constipated for the last 3 or 4 days.  He took some prune juice tonight but did not have any bowel movement.  He reports that he has had some rectal discomfort over the last couple of days.  No rectal bleeding.      Home Medications Prior to Admission medications   Medication Sig Start Date End Date Taking? Authorizing Provider  amLODipine (NORVASC) 5 MG tablet Take 1 tablet by mouth once daily 12/08/21   Cletis Athens, MD  atenolol (TENORMIN) 50 MG tablet Take 1 tablet by mouth once daily 08/26/21   Cletis Athens, MD  diclofenac (VOLTAREN) 75 MG EC tablet Take 1 tablet (75 mg total) by mouth 2 (two) times daily as needed. 12/26/16   Evalee Jefferson, PA-C  doxycycline (DORYX) 100 MG EC tablet Take 100 mg by mouth 2 (two) times daily. Take x 10 days, per patient started med a week ago    [provider]  enalapril (VASOTEC) 20 MG tablet Take 2 tablets by mouth once daily 08/26/21   Cletis Athens, MD  hydrochlorothiazide (HYDRODIURIL) 25 MG tablet Take 1 tablet by mouth daily. 12/13/16   [provider]  lovastatin (MEVACOR) 40 MG tablet Take 1 tablet by mouth daily. 12/13/16   [provider]      Allergies    Patient has no known allergies.    Review of Systems   Review of Systems  Physical Exam Updated Vital Signs BP (!) 163/81   Pulse 87   Temp 98.7 F (37.1 C)   Resp 15   Ht '5\' 9"'$  (1.753 m)   Wt 121.2 kg   SpO2 97%   BMI 39.46 kg/m  Physical Exam Vitals and nursing note reviewed.  Constitutional:      General: He is not in acute distress.    Appearance: He is well-developed.  HENT:     Head: Normocephalic and atraumatic.     Mouth/Throat:     Mouth: Mucous  membranes are moist.  Eyes:     General: Vision grossly intact. Gaze aligned appropriately.     Extraocular Movements: Extraocular movements intact.     Conjunctiva/sclera: Conjunctivae normal.  Cardiovascular:     Rate and Rhythm: Normal rate and regular rhythm.     Pulses: Normal pulses.     Heart sounds: Normal heart sounds, S1 normal and S2 normal. No murmur heard.   No friction rub. No gallop.  Pulmonary:     Effort: Pulmonary effort is normal. No respiratory distress.     Breath sounds: Normal breath sounds.  Abdominal:     Palpations: Abdomen is soft.     Tenderness: There is no abdominal tenderness. There is no guarding or rebound.     Hernia: No hernia is present.  Musculoskeletal:        General: No swelling.     Cervical back: Full passive range of motion without pain, normal range of motion and neck supple. No pain with movement, spinous process tenderness or muscular tenderness. Normal range of motion.     Right lower leg: No edema.     Left lower leg: No edema.  Skin:    General: Skin is  warm and dry.     Capillary Refill: Capillary refill takes less than 2 seconds.     Findings: No ecchymosis, erythema, lesion or wound.  Neurological:     Mental Status: He is alert and oriented to person, place, and time.     GCS: GCS eye subscore is 4. GCS verbal subscore is 5. GCS motor subscore is 6.     Cranial Nerves: Cranial nerves 2-12 are intact.     Sensory: Sensation is intact.     Motor: Motor function is intact. No weakness or abnormal muscle tone.     Coordination: Coordination is intact.  Psychiatric:        Mood and Affect: Mood normal.        Speech: Speech normal.        Behavior: Behavior normal.    ED Results / Procedures / Treatments   Labs (all labs ordered are listed, but only abnormal results are displayed) Labs Reviewed  CBC WITH DIFFERENTIAL/PLATELET - Abnormal; Notable for the following components:      Result Value   WBC 11.7 (*)    RBC 4.09 (*)     HCT 38.0 (*)    RDW 17.8 (*)    Neutro Abs 9.4 (*)    Abs Immature Granulocytes 0.08 (*)    All other components within normal limits  COMPREHENSIVE METABOLIC PANEL - Abnormal; Notable for the following components:   Sodium 132 (*)    Glucose, Bld 120 (*)    Calcium 8.7 (*)    Alkaline Phosphatase 25 (*)    All other components within normal limits    EKG None  Radiology DG Abdomen 1 View  Result Date: 01/23/2022 CLINICAL DATA:  Constipation. EXAM: ABDOMEN - 1 VIEW COMPARISON:  None Available. FINDINGS: The bowel gas pattern is normal. Large amount of stool is seen throughout the colon. No radio-opaque calculi or other significant radiographic abnormality are seen. IMPRESSION: 1. Nonobstructive bowel gas pattern.  Large stool burden. Electronically Signed   By: Ronney Asters M.D.   On: 01/23/2022 20:25   CT ABDOMEN PELVIS W CONTRAST  Result Date: 01/24/2022 CLINICAL DATA:  Abdominal pain, acute, nonlocalized EXAM: CT ABDOMEN AND PELVIS WITH CONTRAST TECHNIQUE: Multidetector CT imaging of the abdomen and pelvis was performed using the standard protocol following bolus administration of intravenous contrast. RADIATION DOSE REDUCTION: This exam was performed according to the departmental dose-optimization program which includes automated exposure control, adjustment of the mA and/or kV according to patient size and/or use of iterative reconstruction technique. CONTRAST:  18m OMNIPAQUE IOHEXOL 300 MG/ML  SOLN COMPARISON:  10/26/2019 FINDINGS: Lower chest: Coronary artery and aortic calcifications. No acute abnormality Hepatobiliary: Small layering gallstones within the gallbladder, unchanged. No focal hepatic abnormality or biliary ductal dilatation. Pancreas: No focal abnormality or ductal dilatation. Spleen: No focal abnormality.  Normal size. Adrenals/Urinary Tract: No adrenal abnormality. No focal renal abnormality. No stones or hydronephrosis. Urinary bladder is unremarkable.  Stomach/Bowel: Normal appendix. Sigmoid diverticulosis. No active diverticulitis. Stomach and small bowel decompressed, unremarkable. No excessive stool burden. Vascular/Lymphatic: Heavily calcified aorta and iliac vessels. No evidence of aneurysm or adenopathy. Reproductive: No visible focal abnormality. Other: No free fluid or free air. Musculoskeletal: No acute bony abnormality. Degenerative changes in the lower lumbar spine. IMPRESSION: No acute findings in the abdomen or pelvis. Coronary artery disease, aortic atherosclerosis. Cholelithiasis. Sigmoid diverticulosis. Electronically Signed   By: KRolm BaptiseM.D.   On: 01/24/2022 03:14    Procedures Procedures  Medications Ordered in ED Medications  iohexol (OMNIPAQUE) 300 MG/ML solution 100 mL (100 mLs Intravenous Contrast Given 01/24/22 0243)    ED Course/ Medical Decision Making/ A&P                           Medical Decision Making Amount and/or Complexity of Data Reviewed Labs: ordered. Radiology: ordered.  Risk Prescription drug management.   Patient appears well.  Abdominal exam is benign.  He is complaining of constipation.  X-ray raise concern for increased stool burden.  Based on his complaints and age, it was felt necessary to rule out inflammatory process, infection and other more significant intra-abdominal process causing his symptoms.  He therefore underwent CT scan which showed no acute pathology.  This includes no significant constipation.        Final Clinical Impression(s) / ED Diagnoses Final diagnoses:  Constipation, unspecified constipation type    Rx / DC Orders ED Discharge Orders     None         Kourtland Coopman, Gwenyth Allegra, MD 01/24/22 [phone]

## 2022-02-13 ENCOUNTER — Other Ambulatory Visit: Payer: Self-pay | Admitting: Internal Medicine

## 2022-02-13 DIAGNOSIS — I10 Essential (primary) hypertension: Secondary | ICD-10-CM

## 2022-03-13 ENCOUNTER — Other Ambulatory Visit: Payer: Self-pay | Admitting: Internal Medicine

## 2022-03-25 DIAGNOSIS — E785 Hyperlipidemia, unspecified: Secondary | ICD-10-CM | POA: Diagnosis not present

## 2022-03-25 DIAGNOSIS — I10 Essential (primary) hypertension: Secondary | ICD-10-CM | POA: Diagnosis not present

## 2022-03-25 DIAGNOSIS — R7301 Impaired fasting glucose: Secondary | ICD-10-CM | POA: Diagnosis not present

## 2022-03-30 DIAGNOSIS — N433 Hydrocele, unspecified: Secondary | ICD-10-CM | POA: Diagnosis not present

## 2022-03-30 DIAGNOSIS — D649 Anemia, unspecified: Secondary | ICD-10-CM | POA: Diagnosis not present

## 2022-03-30 DIAGNOSIS — N289 Disorder of kidney and ureter, unspecified: Secondary | ICD-10-CM | POA: Diagnosis not present

## 2022-03-30 DIAGNOSIS — R7303 Prediabetes: Secondary | ICD-10-CM | POA: Diagnosis not present

## 2022-03-30 DIAGNOSIS — Z125 Encounter for screening for malignant neoplasm of prostate: Secondary | ICD-10-CM | POA: Diagnosis not present

## 2022-03-30 DIAGNOSIS — I10 Essential (primary) hypertension: Secondary | ICD-10-CM | POA: Diagnosis not present

## 2022-03-30 DIAGNOSIS — R7301 Impaired fasting glucose: Secondary | ICD-10-CM | POA: Diagnosis not present

## 2022-03-30 DIAGNOSIS — E6609 Other obesity due to excess calories: Secondary | ICD-10-CM | POA: Diagnosis not present

## 2022-03-30 DIAGNOSIS — Z6839 Body mass index (BMI) 39.0-39.9, adult: Secondary | ICD-10-CM | POA: Diagnosis not present

## 2022-03-30 DIAGNOSIS — E785 Hyperlipidemia, unspecified: Secondary | ICD-10-CM | POA: Diagnosis not present

## 2022-03-30 DIAGNOSIS — I517 Cardiomegaly: Secondary | ICD-10-CM | POA: Diagnosis not present

## 2022-04-10 DIAGNOSIS — M545 Low back pain, unspecified: Secondary | ICD-10-CM | POA: Diagnosis not present

## 2022-04-14 ENCOUNTER — Other Ambulatory Visit (HOSPITAL_COMMUNITY): Payer: Self-pay | Admitting: Family Medicine

## 2022-04-14 ENCOUNTER — Ambulatory Visit (HOSPITAL_COMMUNITY)
Admission: RE | Admit: 2022-04-14 | Discharge: 2022-04-14 | Disposition: A | Discharge status: Home / Self Care | Payer: Medicare HMO | Source: Ambulatory Visit | Attending: Family Medicine | Admitting: Family Medicine

## 2022-04-14 DIAGNOSIS — J069 Acute upper respiratory infection, unspecified: Secondary | ICD-10-CM | POA: Diagnosis not present

## 2022-04-14 DIAGNOSIS — R0602 Shortness of breath: Secondary | ICD-10-CM | POA: Diagnosis not present

## 2022-04-14 DIAGNOSIS — R079 Chest pain, unspecified: Secondary | ICD-10-CM | POA: Diagnosis not present

## 2022-04-22 DIAGNOSIS — R600 Localized edema: Secondary | ICD-10-CM | POA: Diagnosis not present

## 2022-04-22 DIAGNOSIS — R059 Cough, unspecified: Secondary | ICD-10-CM | POA: Diagnosis not present

## 2022-04-22 DIAGNOSIS — R0609 Other forms of dyspnea: Secondary | ICD-10-CM | POA: Diagnosis not present

## 2022-04-22 DIAGNOSIS — J069 Acute upper respiratory infection, unspecified: Secondary | ICD-10-CM | POA: Diagnosis not present

## 2022-05-13 DIAGNOSIS — R0789 Other chest pain: Secondary | ICD-10-CM | POA: Diagnosis not present

## 2022-05-13 DIAGNOSIS — I10 Essential (primary) hypertension: Secondary | ICD-10-CM | POA: Diagnosis not present

## 2022-06-13 ENCOUNTER — Other Ambulatory Visit: Payer: Self-pay | Admitting: Internal Medicine

## 2022-06-13 DIAGNOSIS — I10 Essential (primary) hypertension: Secondary | ICD-10-CM

## 2022-06-30 DIAGNOSIS — M545 Low back pain, unspecified: Secondary | ICD-10-CM | POA: Diagnosis not present

## 2022-07-28 ENCOUNTER — Ambulatory Visit (HOSPITAL_COMMUNITY)
Admission: RE | Admit: 2022-07-28 | Discharge: 2022-07-28 | Disposition: A | Discharge status: Home / Self Care | Payer: Medicare HMO | Source: Ambulatory Visit | Attending: Preventative Medicine | Admitting: Preventative Medicine

## 2022-07-28 ENCOUNTER — Other Ambulatory Visit (HOSPITAL_COMMUNITY): Payer: Self-pay | Admitting: Preventative Medicine

## 2022-07-28 DIAGNOSIS — R109 Unspecified abdominal pain: Secondary | ICD-10-CM | POA: Diagnosis not present

## 2022-07-31 DIAGNOSIS — M545 Low back pain, unspecified: Secondary | ICD-10-CM | POA: Diagnosis not present

## 2022-08-18 ENCOUNTER — Other Ambulatory Visit: Payer: Self-pay | Admitting: Internal Medicine

## 2022-08-27 IMAGING — DX DG CHEST 2V
2 series · 2 of 2 positions shown · non-contrast
Comparison: November 09, 2005

CLINICAL DATA: Fatigue and shortness of breath for 2 weeks

EXAM:
CHEST - 2 VIEW

[chest pa]
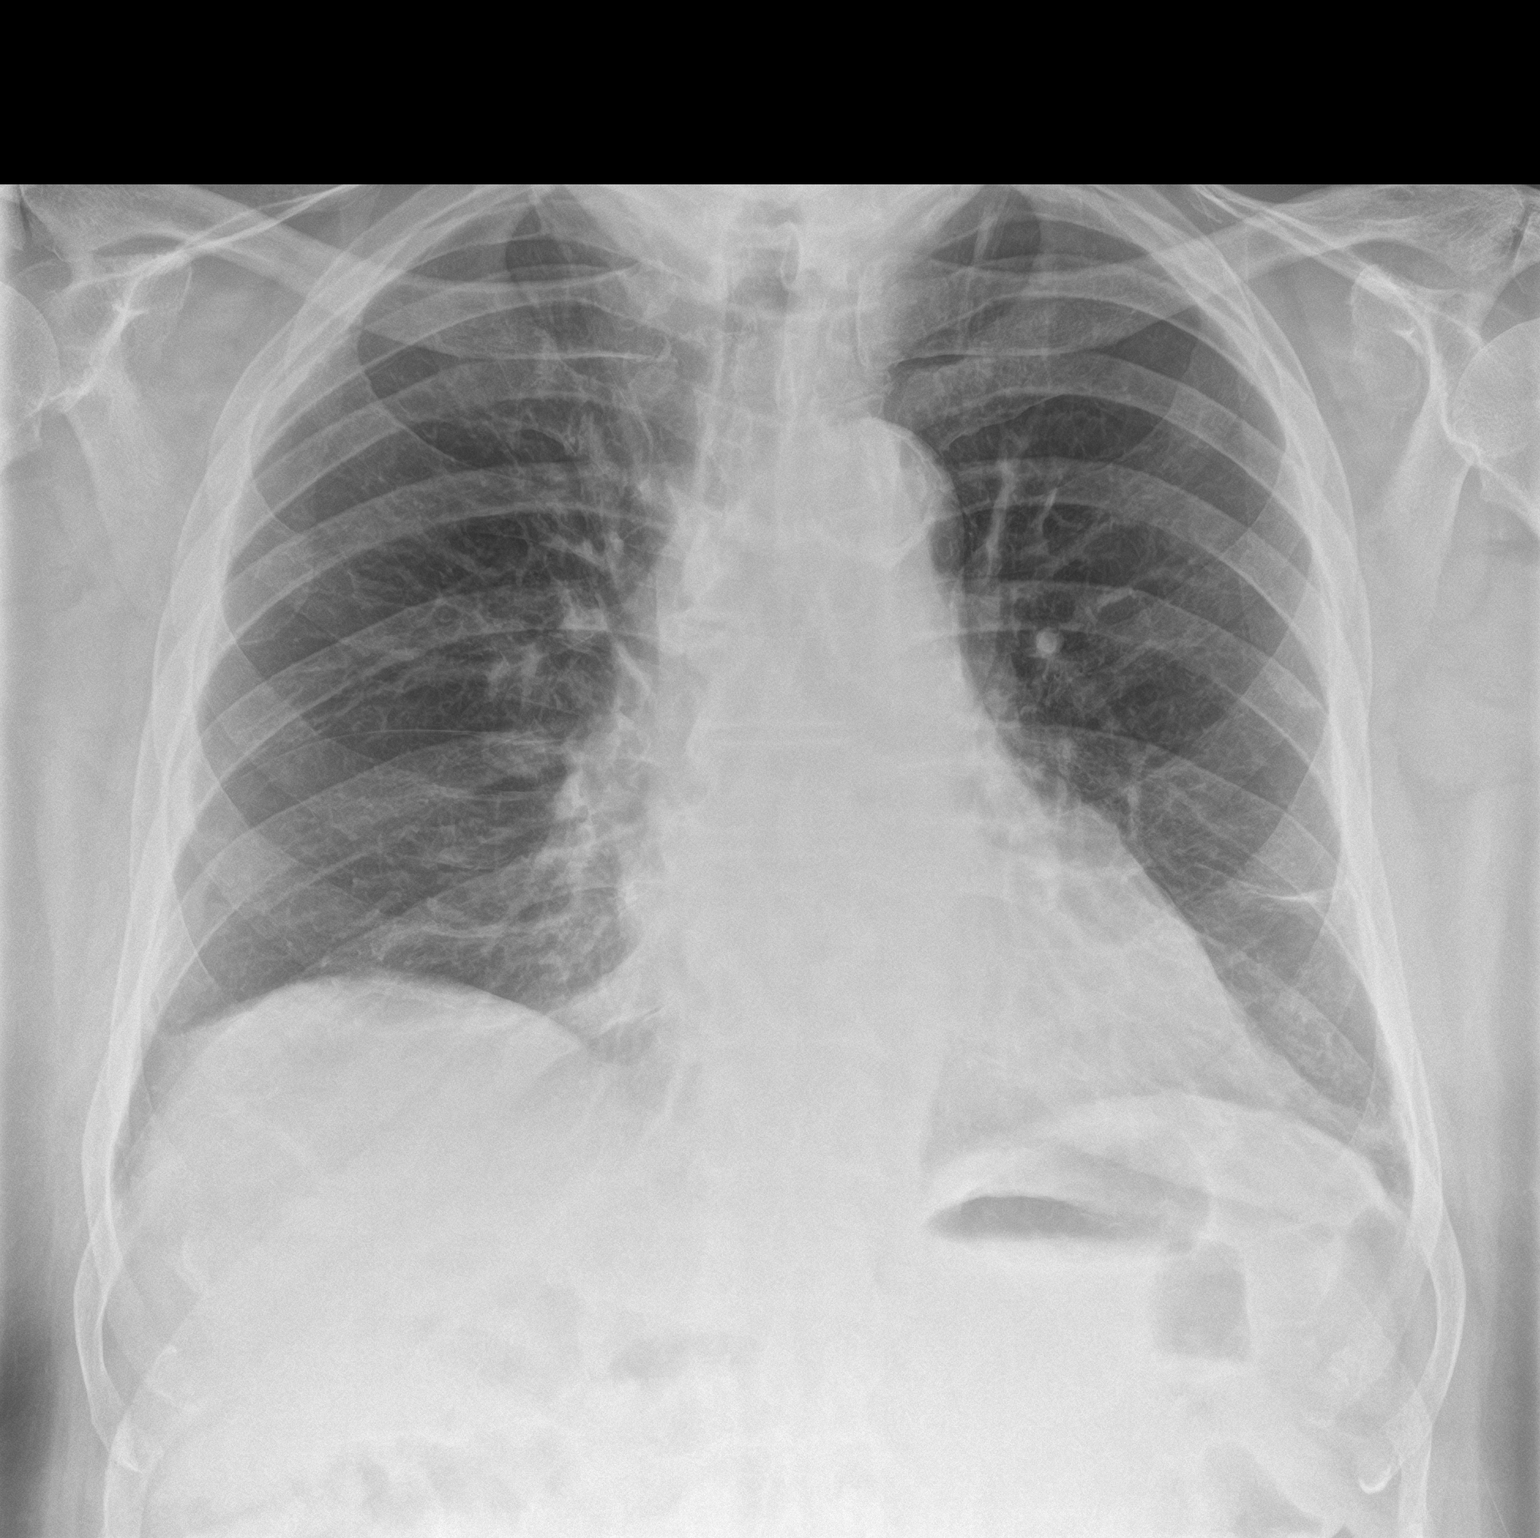

[chest lat]
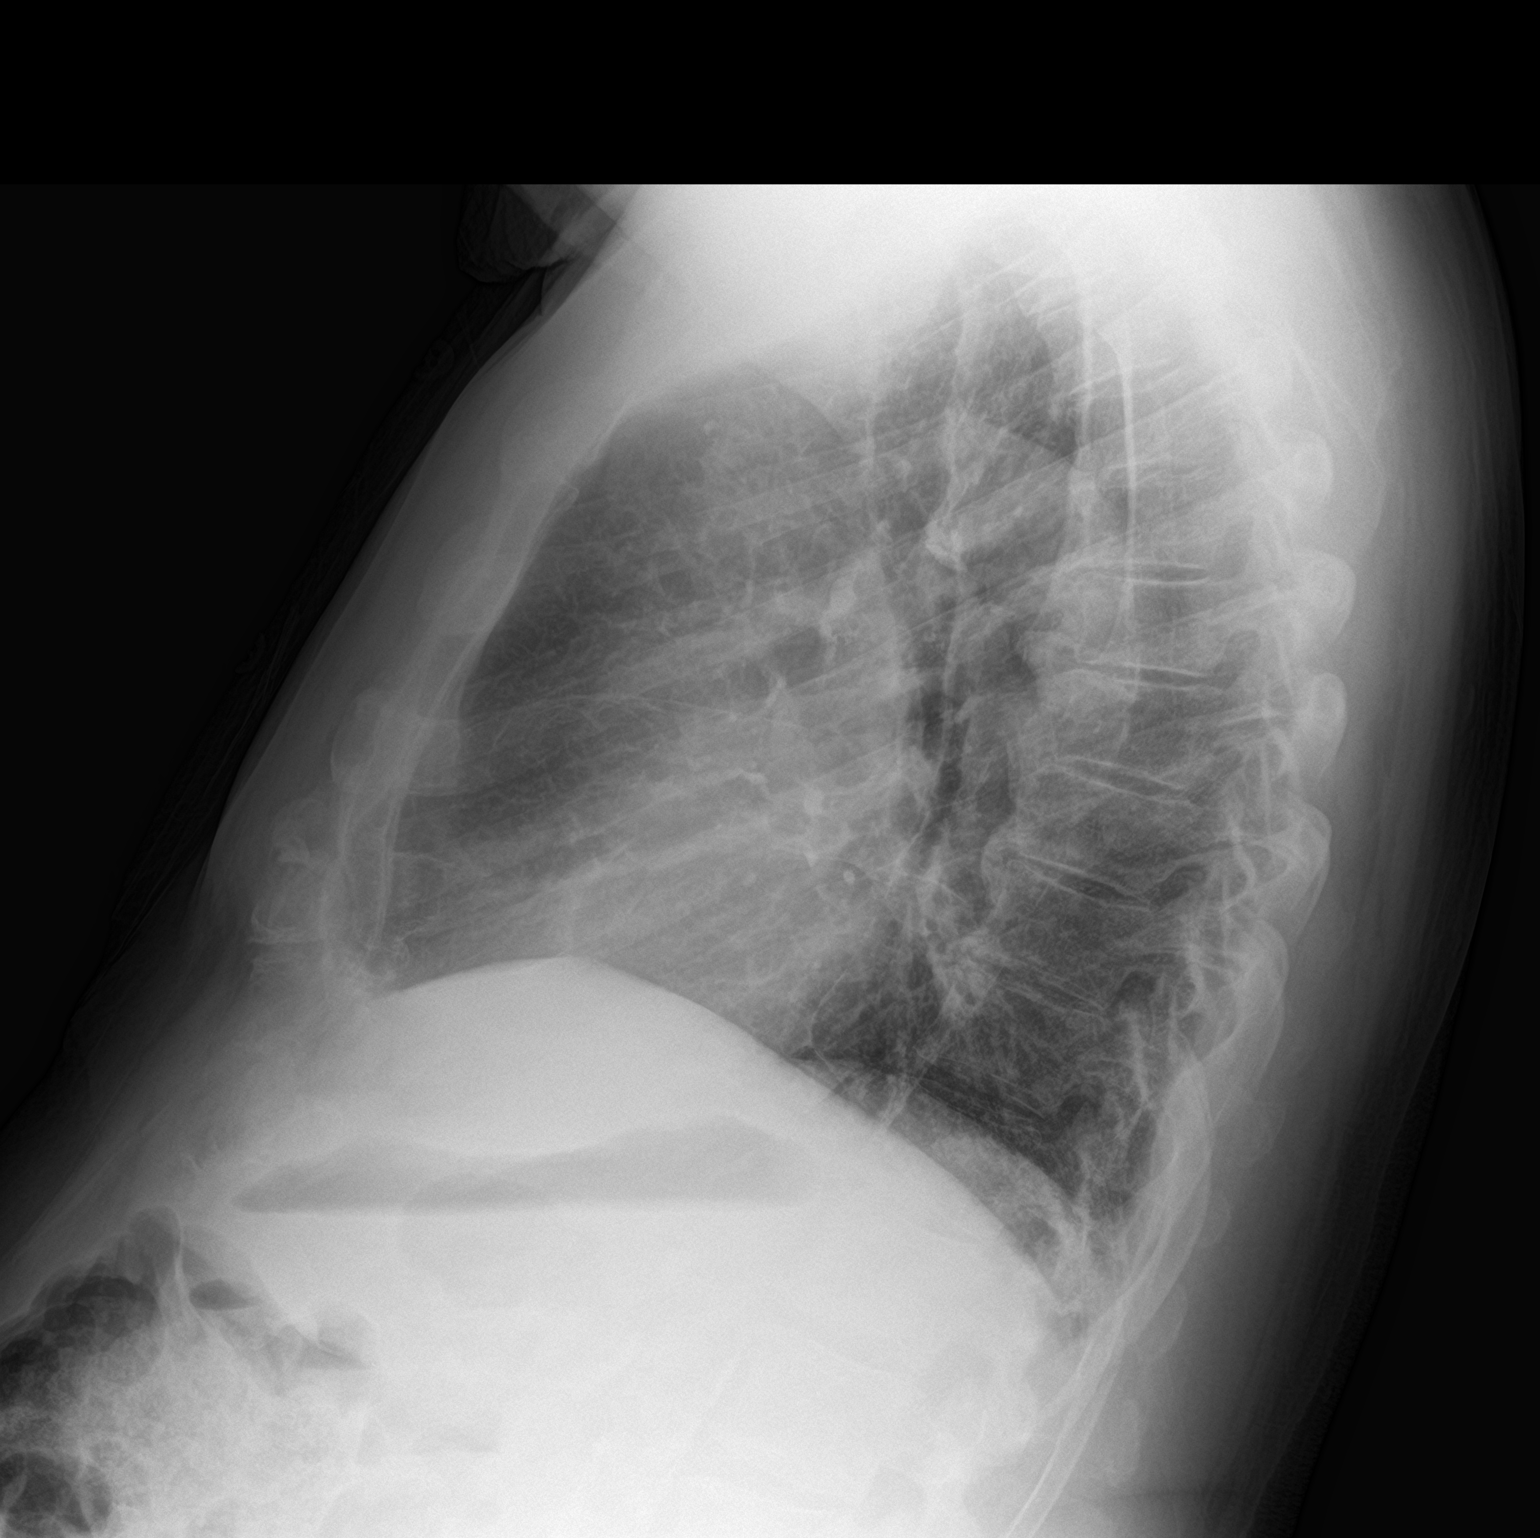

[2 of 2 positions shown; findings below may reference images not displayed]

FINDINGS: The heart size and mediastinal contours are within normal limits.
Minor linear scar is identified in the lateral left lung base. There
is no focal infiltrate, pulmonary edema, or pleural effusion. The
visualized skeletal structures are stable.
IMPRESSION: No active cardiopulmonary disease.

## 2022-09-13 ENCOUNTER — Other Ambulatory Visit: Payer: Self-pay | Admitting: Internal Medicine

## 2022-09-13 DIAGNOSIS — I10 Essential (primary) hypertension: Secondary | ICD-10-CM

## 2022-09-24 DIAGNOSIS — E785 Hyperlipidemia, unspecified: Secondary | ICD-10-CM | POA: Diagnosis not present

## 2022-09-24 DIAGNOSIS — Z125 Encounter for screening for malignant neoplasm of prostate: Secondary | ICD-10-CM | POA: Diagnosis not present

## 2022-09-30 DIAGNOSIS — J069 Acute upper respiratory infection, unspecified: Secondary | ICD-10-CM | POA: Diagnosis not present

## 2022-09-30 DIAGNOSIS — Z0000 Encounter for general adult medical examination without abnormal findings: Secondary | ICD-10-CM | POA: Diagnosis not present

## 2022-09-30 DIAGNOSIS — E785 Hyperlipidemia, unspecified: Secondary | ICD-10-CM | POA: Diagnosis not present

## 2022-09-30 DIAGNOSIS — Z87891 Personal history of nicotine dependence: Secondary | ICD-10-CM | POA: Diagnosis not present

## 2022-09-30 DIAGNOSIS — D649 Anemia, unspecified: Secondary | ICD-10-CM | POA: Diagnosis not present

## 2022-09-30 DIAGNOSIS — N433 Hydrocele, unspecified: Secondary | ICD-10-CM | POA: Diagnosis not present

## 2022-09-30 DIAGNOSIS — I10 Essential (primary) hypertension: Secondary | ICD-10-CM | POA: Diagnosis not present

## 2022-09-30 DIAGNOSIS — R7301 Impaired fasting glucose: Secondary | ICD-10-CM | POA: Diagnosis not present

## 2022-09-30 DIAGNOSIS — Z79899 Other long term (current) drug therapy: Secondary | ICD-10-CM | POA: Diagnosis not present

## 2022-09-30 DIAGNOSIS — N289 Disorder of kidney and ureter, unspecified: Secondary | ICD-10-CM | POA: Diagnosis not present

## 2022-09-30 DIAGNOSIS — R7303 Prediabetes: Secondary | ICD-10-CM | POA: Diagnosis not present

## 2022-10-22 DIAGNOSIS — R69 Illness, unspecified: Secondary | ICD-10-CM | POA: Diagnosis not present

## 2022-10-22 DIAGNOSIS — I517 Cardiomegaly: Secondary | ICD-10-CM | POA: Diagnosis not present

## 2022-11-20 DIAGNOSIS — D485 Neoplasm of uncertain behavior of skin: Secondary | ICD-10-CM | POA: Diagnosis not present

## 2022-11-20 DIAGNOSIS — D0462 Carcinoma in situ of skin of left upper limb, including shoulder: Secondary | ICD-10-CM | POA: Diagnosis not present

## 2022-11-20 DIAGNOSIS — D2272 Melanocytic nevi of left lower limb, including hip: Secondary | ICD-10-CM | POA: Diagnosis not present

## 2022-11-20 DIAGNOSIS — D2271 Melanocytic nevi of right lower limb, including hip: Secondary | ICD-10-CM | POA: Diagnosis not present

## 2022-11-20 DIAGNOSIS — D225 Melanocytic nevi of trunk: Secondary | ICD-10-CM | POA: Diagnosis not present

## 2022-11-20 DIAGNOSIS — D2262 Melanocytic nevi of left upper limb, including shoulder: Secondary | ICD-10-CM | POA: Diagnosis not present

## 2022-11-20 DIAGNOSIS — L570 Actinic keratosis: Secondary | ICD-10-CM | POA: Diagnosis not present

## 2022-11-20 DIAGNOSIS — D2261 Melanocytic nevi of right upper limb, including shoulder: Secondary | ICD-10-CM | POA: Diagnosis not present

## 2022-11-20 DIAGNOSIS — L821 Other seborrheic keratosis: Secondary | ICD-10-CM | POA: Diagnosis not present

## 2022-12-02 DIAGNOSIS — H524 Presbyopia: Secondary | ICD-10-CM | POA: Diagnosis not present

## 2022-12-16 DIAGNOSIS — D0462 Carcinoma in situ of skin of left upper limb, including shoulder: Secondary | ICD-10-CM | POA: Diagnosis not present

## 2023-01-21 DIAGNOSIS — R9439 Abnormal result of other cardiovascular function study: Secondary | ICD-10-CM | POA: Diagnosis not present

## 2023-01-21 DIAGNOSIS — R0789 Other chest pain: Secondary | ICD-10-CM | POA: Diagnosis not present

## 2023-01-21 DIAGNOSIS — E785 Hyperlipidemia, unspecified: Secondary | ICD-10-CM | POA: Diagnosis not present

## 2023-01-21 DIAGNOSIS — I10 Essential (primary) hypertension: Secondary | ICD-10-CM | POA: Diagnosis not present

## 2023-03-11 DIAGNOSIS — H2513 Age-related nuclear cataract, bilateral: Secondary | ICD-10-CM | POA: Diagnosis not present

## 2023-03-11 DIAGNOSIS — H25013 Cortical age-related cataract, bilateral: Secondary | ICD-10-CM | POA: Diagnosis not present

## 2023-03-11 DIAGNOSIS — H18413 Arcus senilis, bilateral: Secondary | ICD-10-CM | POA: Diagnosis not present

## 2023-03-11 DIAGNOSIS — H2511 Age-related nuclear cataract, right eye: Secondary | ICD-10-CM | POA: Diagnosis not present

## 2023-03-11 DIAGNOSIS — H25043 Posterior subcapsular polar age-related cataract, bilateral: Secondary | ICD-10-CM | POA: Diagnosis not present

## 2023-04-08 DIAGNOSIS — J0100 Acute maxillary sinusitis, unspecified: Secondary | ICD-10-CM | POA: Diagnosis not present

## 2023-04-08 DIAGNOSIS — E785 Hyperlipidemia, unspecified: Secondary | ICD-10-CM | POA: Diagnosis not present

## 2023-04-08 DIAGNOSIS — Z7182 Exercise counseling: Secondary | ICD-10-CM | POA: Diagnosis not present

## 2023-04-08 DIAGNOSIS — Z79899 Other long term (current) drug therapy: Secondary | ICD-10-CM | POA: Diagnosis not present

## 2023-04-08 DIAGNOSIS — Z6838 Body mass index (BMI) 38.0-38.9, adult: Secondary | ICD-10-CM | POA: Diagnosis not present

## 2023-04-08 DIAGNOSIS — Z713 Dietary counseling and surveillance: Secondary | ICD-10-CM | POA: Diagnosis not present

## 2023-04-08 DIAGNOSIS — Z125 Encounter for screening for malignant neoplasm of prostate: Secondary | ICD-10-CM | POA: Diagnosis not present

## 2023-04-08 DIAGNOSIS — I10 Essential (primary) hypertension: Secondary | ICD-10-CM | POA: Diagnosis not present

## 2023-04-14 DIAGNOSIS — R7303 Prediabetes: Secondary | ICD-10-CM | POA: Diagnosis not present

## 2023-04-14 DIAGNOSIS — N289 Disorder of kidney and ureter, unspecified: Secondary | ICD-10-CM | POA: Diagnosis not present

## 2023-04-14 DIAGNOSIS — I10 Essential (primary) hypertension: Secondary | ICD-10-CM | POA: Diagnosis not present

## 2023-04-14 DIAGNOSIS — Z0000 Encounter for general adult medical examination without abnormal findings: Secondary | ICD-10-CM | POA: Diagnosis not present

## 2023-04-14 DIAGNOSIS — Z79899 Other long term (current) drug therapy: Secondary | ICD-10-CM | POA: Diagnosis not present

## 2023-04-14 DIAGNOSIS — I517 Cardiomegaly: Secondary | ICD-10-CM | POA: Diagnosis not present

## 2023-04-14 DIAGNOSIS — N433 Hydrocele, unspecified: Secondary | ICD-10-CM | POA: Diagnosis not present

## 2023-04-14 DIAGNOSIS — E785 Hyperlipidemia, unspecified: Secondary | ICD-10-CM | POA: Diagnosis not present

## 2023-04-14 DIAGNOSIS — D649 Anemia, unspecified: Secondary | ICD-10-CM | POA: Diagnosis not present

## 2023-04-14 DIAGNOSIS — Z87891 Personal history of nicotine dependence: Secondary | ICD-10-CM | POA: Diagnosis not present

## 2023-07-08 DIAGNOSIS — Z6839 Body mass index (BMI) 39.0-39.9, adult: Secondary | ICD-10-CM | POA: Insufficient documentation

## 2023-07-08 DIAGNOSIS — R918 Other nonspecific abnormal finding of lung field: Secondary | ICD-10-CM | POA: Insufficient documentation

## 2023-07-08 DIAGNOSIS — E785 Hyperlipidemia, unspecified: Secondary | ICD-10-CM | POA: Insufficient documentation

## 2023-07-08 DIAGNOSIS — R0600 Dyspnea, unspecified: Secondary | ICD-10-CM | POA: Insufficient documentation

## 2023-07-08 DIAGNOSIS — I10 Essential (primary) hypertension: Secondary | ICD-10-CM | POA: Insufficient documentation

## 2023-07-08 DIAGNOSIS — R600 Localized edema: Secondary | ICD-10-CM | POA: Insufficient documentation

## 2023-07-08 DIAGNOSIS — Z79899 Other long term (current) drug therapy: Secondary | ICD-10-CM | POA: Insufficient documentation

## 2023-07-08 DIAGNOSIS — Z86718 Personal history of other venous thrombosis and embolism: Secondary | ICD-10-CM | POA: Insufficient documentation

## 2023-07-08 DIAGNOSIS — E860 Dehydration: Secondary | ICD-10-CM | POA: Insufficient documentation

## 2023-07-08 DIAGNOSIS — Z1152 Encounter for screening for COVID-19: Secondary | ICD-10-CM | POA: Insufficient documentation

## 2023-07-09 ENCOUNTER — Encounter: Payer: Self-pay | Admitting: Family Medicine

## 2023-07-09 ENCOUNTER — Ambulatory Visit (HOSPITAL_COMMUNITY)
Admission: RE | Admit: 2023-07-09 | Discharge: 2023-07-09 | Disposition: A | Discharge status: Home / Self Care | Payer: Medicare HMO | Source: Ambulatory Visit | Attending: Family Medicine | Admitting: Family Medicine

## 2023-07-09 ENCOUNTER — Encounter (HOSPITAL_COMMUNITY): Payer: Self-pay

## 2023-07-09 ENCOUNTER — Emergency Department (HOSPITAL_COMMUNITY)
Admission: EM | Admit: 2023-07-09 | Discharge: 2023-07-09 | Disposition: A | Discharge status: Home / Self Care | Payer: Medicare HMO | Attending: Emergency Medicine | Admitting: Emergency Medicine

## 2023-07-09 ENCOUNTER — Emergency Department (HOSPITAL_COMMUNITY): Payer: Medicare HMO

## 2023-07-09 ENCOUNTER — Other Ambulatory Visit: Payer: Self-pay

## 2023-07-09 ENCOUNTER — Other Ambulatory Visit (HOSPITAL_COMMUNITY): Payer: Self-pay | Admitting: Family Medicine

## 2023-07-09 DIAGNOSIS — R600 Localized edema: Secondary | ICD-10-CM | POA: Insufficient documentation

## 2023-07-09 DIAGNOSIS — I700 Atherosclerosis of aorta: Secondary | ICD-10-CM | POA: Diagnosis not present

## 2023-07-09 DIAGNOSIS — Z86718 Personal history of other venous thrombosis and embolism: Secondary | ICD-10-CM | POA: Diagnosis not present

## 2023-07-09 DIAGNOSIS — R0600 Dyspnea, unspecified: Secondary | ICD-10-CM | POA: Insufficient documentation

## 2023-07-09 DIAGNOSIS — R5383 Other fatigue: Secondary | ICD-10-CM | POA: Diagnosis not present

## 2023-07-09 DIAGNOSIS — R918 Other nonspecific abnormal finding of lung field: Secondary | ICD-10-CM | POA: Insufficient documentation

## 2023-07-09 DIAGNOSIS — Z1152 Encounter for screening for COVID-19: Secondary | ICD-10-CM | POA: Diagnosis not present

## 2023-07-09 DIAGNOSIS — R0789 Other chest pain: Secondary | ICD-10-CM

## 2023-07-09 DIAGNOSIS — E785 Hyperlipidemia, unspecified: Secondary | ICD-10-CM | POA: Diagnosis not present

## 2023-07-09 DIAGNOSIS — Z86711 Personal history of pulmonary embolism: Secondary | ICD-10-CM | POA: Diagnosis not present

## 2023-07-09 DIAGNOSIS — G8929 Other chronic pain: Secondary | ICD-10-CM | POA: Insufficient documentation

## 2023-07-09 DIAGNOSIS — Z6839 Body mass index (BMI) 39.0-39.9, adult: Secondary | ICD-10-CM | POA: Insufficient documentation

## 2023-07-09 DIAGNOSIS — R9389 Abnormal findings on diagnostic imaging of other specified body structures: Secondary | ICD-10-CM | POA: Insufficient documentation

## 2023-07-09 DIAGNOSIS — R531 Weakness: Secondary | ICD-10-CM | POA: Diagnosis not present

## 2023-07-09 DIAGNOSIS — R079 Chest pain, unspecified: Secondary | ICD-10-CM | POA: Insufficient documentation

## 2023-07-09 DIAGNOSIS — I10 Essential (primary) hypertension: Secondary | ICD-10-CM | POA: Diagnosis not present

## 2023-07-09 DIAGNOSIS — E860 Dehydration: Secondary | ICD-10-CM | POA: Diagnosis not present

## 2023-07-09 DIAGNOSIS — Z79899 Other long term (current) drug therapy: Secondary | ICD-10-CM | POA: Insufficient documentation

## 2023-07-09 DIAGNOSIS — Z20822 Contact with and (suspected) exposure to covid-19: Secondary | ICD-10-CM | POA: Insufficient documentation

## 2023-07-09 LAB — CBC WITH DIFFERENTIAL/PLATELET
Abs Immature Granulocytes: 0.1 10*3/uL — ABNORMAL HIGH (ref 0.00–0.07)
Basophils Absolute: 0 10*3/uL (ref 0.0–0.1)
Basophils Relative: 0 %
Eosinophils Absolute: 0.1 10*3/uL (ref 0.0–0.5)
Eosinophils Relative: 1 %
HCT: 35.9 % — ABNORMAL LOW (ref 39.0–52.0)
Hemoglobin: 11.9 g/dL — ABNORMAL LOW (ref 13.0–17.0)
Immature Granulocytes: 2 %
Lymphocytes Relative: 40 %
Lymphs Abs: 2.6 10*3/uL (ref 0.7–4.0)
MCH: 30.7 pg (ref 26.0–34.0)
MCHC: 33.1 g/dL (ref 30.0–36.0)
MCV: 92.8 fL (ref 80.0–100.0)
Monocytes Absolute: 0.4 10*3/uL (ref 0.1–1.0)
Monocytes Relative: 6 %
Neutro Abs: 3.2 10*3/uL (ref 1.7–7.7)
Neutrophils Relative %: 51 %
Platelets: 206 10*3/uL (ref 150–400)
RBC: 3.87 MIL/uL — ABNORMAL LOW (ref 4.22–5.81)
RDW: 18.6 % — ABNORMAL HIGH (ref 11.5–15.5)
WBC: 6.3 10*3/uL (ref 4.0–10.5)
nRBC: 0 % (ref 0.0–0.2)

## 2023-07-09 LAB — COMPREHENSIVE METABOLIC PANEL
ALT: 17 U/L (ref 0–44)
AST: 22 U/L (ref 15–41)
Albumin: 3.8 g/dL (ref 3.5–5.0)
Alkaline Phosphatase: 25 U/L — ABNORMAL LOW (ref 38–126)
Anion gap: 10 (ref 5–15)
BUN: 21 mg/dL (ref 8–23)
CO2: 24 mmol/L (ref 22–32)
Calcium: 8.7 mg/dL — ABNORMAL LOW (ref 8.9–10.3)
Chloride: 99 mmol/L (ref 98–111)
Creatinine, Ser: 1.31 mg/dL — ABNORMAL HIGH (ref 0.61–1.24)
GFR, Estimated: 52 mL/min — ABNORMAL LOW (ref >60)
Glucose, Bld: 151 mg/dL — ABNORMAL HIGH (ref 70–99)
Potassium: 3.5 mmol/L (ref 3.5–5.1)
Sodium: 133 mmol/L — ABNORMAL LOW (ref 135–145)
Total Bilirubin: 0.5 mg/dL (ref <1.2)
Total Protein: 7 g/dL (ref 6.5–8.1)

## 2023-07-09 LAB — RESP PANEL BY RT-PCR (RSV, FLU A&B, COVID)  RVPGX2
Influenza A by PCR: NEGATIVE
Influenza B by PCR: NEGATIVE
Resp Syncytial Virus by PCR: NEGATIVE
SARS Coronavirus 2 by RT PCR: NEGATIVE

## 2023-07-09 LAB — BRAIN NATRIURETIC PEPTIDE: B Natriuretic Peptide: 252 pg/mL — ABNORMAL HIGH (ref 0.0–100.0)

## 2023-07-09 LAB — TROPONIN I (HIGH SENSITIVITY)
Troponin I (High Sensitivity): 7 ng/L (ref <18)
Troponin I (High Sensitivity): 7 ng/L (ref <18)

## 2023-07-09 NOTE — ED Triage Notes (Signed)
Pt sent from PCP for CXR Pt had outpatient CXR at noon today  PCP called pt back and told pt to come to the ED ASAP, told he has a blood clot  Pt weak x2 days  SOB on exertion

## 2023-07-09 NOTE — Discharge Instructions (Addendum)
It was a pleasure caring for you today in the emergency department.  Drink plenty of water over the next few days.  Follow-up with your PCP on Monday for recheck. Recommend hold your lasix over the weekend. Discuss with Dr Scharlene Gloss office on Monday.  Please return to the emergency department for any worsening or worrisome symptoms.

## 2023-07-09 NOTE — ED Provider Triage Note (Signed)
Emergency Medicine Provider Triage Evaluation Note  Tommy Barton , a 89 y.o. male  was evaluated in triage.  Pt complains of has felt generally unwell for the past couple of days.  He went to his PCP today for this and states they did some tests and sent him for an x-ray and called him and told to come to the ER because he may have a blood clot.  Denies chest pain, states he always has some of breath if he does too much and this is unchanged.  Denies swelling in his extremities.  Review of Systems  Positive: Fatigue Negative: Chest pain  Physical Exam  BP (!) 169/68   Pulse (!) 58   Temp (!) 97.5 F (36.4 C) (Oral)   Resp 19   Ht 5\' 9"  (1.753 m)   Wt 120.7 kg   SpO2 94%   BMI 39.28 kg/m  Gen:   Awake, no distress   Resp:  Normal effort  MSK:   Moves extremities without difficulty  Other:    Medical Decision Making  Medically screening exam initiated at 6:16 PM.  Appropriate orders placed.  Tommy Barton was informed that the remainder of the evaluation will be completed by another provider, this initial triage assessment does not replace that evaluation, and the importance of remaining in the ED until their evaluation is complete.     Ma Rings, [place] 07/09/23 1610

## 2023-07-09 NOTE — ED Provider Notes (Addendum)
[place] EMERGENCY DEPARTMENT AT [facility] Provider Note  CSN: [id number] Arrival date & time: 07/09/23 1729  Chief Complaint(s) Weakness  HPI Tommy Barton is a 89 y.o. male with past medical history as below, significant for HTN, HLD, obesity, cardiomegaly who presents to the ED with complaint of abnormal XR  Patient reports some variable fatigue over the past week, intermittent chest pain which has since resolved.  Intermittent dyspnea which has since resolved.  He has chronic chest pain and dyspnea over the past year which is not sniffily worsened in the past 24-48 hrs.  Reports that he was seen by practitioner at his PCPs office and was instructed to come to the ER because they were concerned that he had a blood clot on his x-ray.  Patient reports his symptoms have currently resolved and he is back to baseline this time.  He is compliant with his home medications.  No abdominal pain, nausea or vomiting, no chest pain currently, no fevers or chills, no dyspnea.  No cough.  He has chronic lower extremity edema which is unchanged baseline, he is on Lasix  History of DVT or PE, no recent travel, no recent hospitalization or surgical intervention  Past Medical History Past Medical History:  Diagnosis Date   Hypertension    Patient Active Problem List   Diagnosis Date Noted   Annual physical exam 05/16/2020   Class 2 severe obesity due to excess calories with serious comorbidity and body mass index (BMI) of 39.0 to 39.9 in adult [facility]) 05/16/2020   Hypertension 05/16/2020   Hyperlipidemia 05/16/2020   Home Medication(s) Prior to Admission medications   Medication Sig Start Date End Date Taking? Authorizing Provider  amLODipine (NORVASC) 5 MG tablet Take 1 tablet by mouth once daily 06/15/22   Corky Downs, MD  atenolol (TENORMIN) 50 MG tablet Take 1 tablet by mouth once daily 08/26/21   Corky Downs, MD  diclofenac (VOLTAREN) 75 MG EC tablet Take 1 tablet (75 mg total)  by mouth 2 (two) times daily as needed. 12/26/16   Burgess Amor, PA-C  doxycycline (DORYX) 100 MG EC tablet Take 100 mg by mouth 2 (two) times daily. Take x 10 days, per patient started med a week ago    [provider]  enalapril (VASOTEC) 20 MG tablet Take 2 tablets by mouth once daily 08/18/22   Corky Downs, MD  hydrochlorothiazide (HYDRODIURIL) 25 MG tablet Take 1 tablet by mouth daily. 12/13/16   [provider]  lovastatin (MEVACOR) 40 MG tablet Take 1 tablet by mouth daily. 12/13/16   [provider]                                                                                                                                    Past Surgical History History reviewed. No pertinent surgical history. Family History History reviewed. No pertinent family history.  Social History Social History  Tobacco Use   Smoking status: Never   Smokeless tobacco: Never  Substance Use Topics   Alcohol use: No   Drug use: No   Allergies Patient has no known allergies.  Review of Systems Review of Systems  Constitutional:  Positive for fatigue.  Respiratory:  Positive for shortness of breath.   Cardiovascular:  Positive for chest pain and leg swelling.  All other systems reviewed and are negative.   Physical Exam Vital Signs  I have reviewed the triage vital signs BP 139/68   Pulse (!) 57   Temp (!) 97.5 F (36.4 C) (Oral)   Resp 18   Ht 5\' 9"  (1.753 m)   Wt 120.7 kg   SpO2 94%   BMI 39.28 kg/m  Physical Exam Vitals and nursing note reviewed.  Constitutional:      General: He is not in acute distress.    Appearance: Normal appearance. He is well-developed. He is obese.  HENT:     Head: Normocephalic and atraumatic.     Right Ear: External ear normal.     Left Ear: External ear normal.     Mouth/Throat:     Mouth: Mucous membranes are moist.  Eyes:     General: No scleral icterus. Cardiovascular:     Rate and Rhythm: Normal rate and regular  rhythm.     Pulses: Normal pulses.     Heart sounds: Normal heart sounds.  Pulmonary:     Effort: Pulmonary effort is normal. No respiratory distress.     Breath sounds: Normal breath sounds.  Abdominal:     General: Abdomen is flat.     Palpations: Abdomen is soft.     Tenderness: There is no abdominal tenderness.  Musculoskeletal:     Cervical back: No rigidity.     Right lower leg: Edema present.     Left lower leg: Edema present.     Comments: LE edema symmetric trace  Skin:    General: Skin is warm and dry.     Capillary Refill: Capillary refill takes less than 2 seconds.  Neurological:     Mental Status: He is alert.  Psychiatric:        Mood and Affect: Mood normal.        Behavior: Behavior normal.     ED Results and Treatments Labs (all labs ordered are listed, but only abnormal results are displayed) Labs Reviewed  CBC WITH DIFFERENTIAL/PLATELET - Abnormal; Notable for the following components:      Result Value   RBC 3.87 (*)    Hemoglobin 11.9 (*)    HCT 35.9 (*)    RDW 18.6 (*)    Abs Immature Granulocytes 0.10 (*)    All other components within normal limits  COMPREHENSIVE METABOLIC PANEL - Abnormal; Notable for the following components:   Sodium 133 (*)    Glucose, Bld 151 (*)    Creatinine, Ser 1.31 (*)    Calcium 8.7 (*)    Alkaline Phosphatase 25 (*)    GFR, Estimated 52 (*)    All other components within normal limits  BRAIN NATRIURETIC PEPTIDE - Abnormal; Notable for the following components:   B Natriuretic Peptide 252.0 (*)    All other components within normal limits  RESP PANEL BY RT-PCR (RSV, FLU A&B, COVID)  RVPGX2  TROPONIN I (HIGH SENSITIVITY)  TROPONIN I (HIGH SENSITIVITY)  Radiology DG Chest 2 View  Result Date: 07/09/2023 CLINICAL DATA:  Weakness. EXAM: CHEST - 2 VIEW COMPARISON:  07/09/2023. FINDINGS: The heart  size and mediastinal contours are within normal limits. There is atherosclerotic calcification of the aorta. Subsegmental atelectasis or scarring is noted in the mid to lower left lung. No consolidation, effusion, or pneumothorax. Degenerative changes are present in the thoracic spine. No acute osseous abnormality. IMPRESSION: No active cardiopulmonary disease. Electronically Signed   By: Thornell Sartorius M.D.   On: 07/09/2023 22:13   DG Chest 2 View  Result Date: 07/09/2023 CLINICAL DATA:  Chest pain. EXAM: CHEST - 2 VIEW COMPARISON:  April 14, 2022. FINDINGS: Stable cardiomediastinal silhouette. Stable left lingular scarring. No acute abnormality seen. The visualized skeletal structures are unremarkable. IMPRESSION: No active cardiopulmonary disease. Electronically Signed   By: Lupita Raider M.D.   On: 07/09/2023 15:24    Pertinent labs & imaging results that were available during my care of the patient were reviewed by me and considered in my medical decision making (see MDM for details).  Medications Ordered in ED Medications - No data to display                                                                                                                                   Procedures Procedures  (including critical care time)  Medical Decision Making / ED Course    Medical Decision Making:    Jahseh ROCKEY ENTWISLE is a 89 y.o. male with past medical history as below, significant for HTN, HLD, obesity, cardiomegaly who presents to the ED with complaint of abnormal XR. The complaint involves an extensive differential diagnosis and also carries with it a high risk of complications and morbidity.  Serious etiology was considered. Ddx includes but is not limited to: Differential includes all life-threatening causes for chest pain. This includes but is not exclusive to acute coronary syndrome, aortic dissection, pulmonary embolism, cardiac tamponade, community-acquired pneumonia, pericarditis,  musculoskeletal chest wall pain, metabolic derangement, dehydration, medication effect etc.   Complete initial physical exam performed, notably the patient  was no acute distress, asymptomatic.    Reviewed and confirmed nursing documentation for past medical history, family history, social history.  Vital signs reviewed.        Labs obtained from triage reviewed and are reassuring.  Mildly elevated creatinine from baseline, no hypotension.  Potassium and bicarb are normal.  Encourage patient to continue drinking liquids, he has no nausea or vomiting.  BNP is marginally elevated, he has trace lower extremity edema chronically, he is on Lasix at home.  CXR pending but on wet read looks okay, XR from this AM was WNL  XR wnl on formal read  He has mild dehydration/elev cr, advised him to increase his water intake over the next few days, follow-up with PCP next week for recheck. Hold lasix over the weekend.   Wells score is low,  no unilateral leg swelling, no significant chest pain or dyspnea.  Suspicion for PE is low.    The patient improved significantly and was discharged in stable condition. Detailed discussions were had with the patient regarding current findings, and need for close f/u with PCP or on call doctor. The patient has been instructed to return immediately if the symptoms worsen in any way for re-evaluation. Patient verbalized understanding and is in agreement with current care plan. All questions answered prior to discharge.                   Additional history obtained: -Additional history obtained from family -External records from outside source obtained and reviewed including: Chart review including previous notes, labs, imaging, consultation notes including  Prior labs Prior imaging    Lab Tests: -I ordered, reviewed, and interpreted labs.   The pertinent results include:   Labs Reviewed  CBC WITH DIFFERENTIAL/PLATELET - Abnormal; Notable for the  following components:      Result Value   RBC 3.87 (*)    Hemoglobin 11.9 (*)    HCT 35.9 (*)    RDW 18.6 (*)    Abs Immature Granulocytes 0.10 (*)    All other components within normal limits  COMPREHENSIVE METABOLIC PANEL - Abnormal; Notable for the following components:   Sodium 133 (*)    Glucose, Bld 151 (*)    Creatinine, Ser 1.31 (*)    Calcium 8.7 (*)    Alkaline Phosphatase 25 (*)    GFR, Estimated 52 (*)    All other components within normal limits  BRAIN NATRIURETIC PEPTIDE - Abnormal; Notable for the following components:   B Natriuretic Peptide 252.0 (*)    All other components within normal limits  RESP PANEL BY RT-PCR (RSV, FLU A&B, COVID)  RVPGX2  TROPONIN I (HIGH SENSITIVITY)  TROPONIN I (HIGH SENSITIVITY)    Notable for cr mild elev, bnp mild elev  EKG   EKG Interpretation Date/Time:  Friday July 09 2023 17:43:51 EST Ventricular Rate:  59 PR Interval:  204 QRS Duration:  92 QT Interval:  426 QTC Calculation: 421 R Axis:   2  Text Interpretation: Sinus bradycardia Septal infarct , age undetermined Abnormal ECG No previous ECGs available no stemi no prior Confirmed by Tanda Rockers ([id number]) on 07/09/2023 10:02:53 PM         Imaging Studies ordered: I ordered imaging studies including CXR I independently visualized the following imaging with scope of interpretation limited to determining acute life threatening conditions related to emergency care; no large ptx, no large effusion, formal interpretation WNL; I agree    Medicines ordered and prescription drug management: No orders of the defined types were placed in this encounter.   -I have reviewed the patients home medicines and have made adjustments as needed   Consultations Obtained: na   Cardiac Monitoring: Continuous pulse oximetry interpreted by myself, 97% on RA.    Social Determinants of Health:  Diagnosis or treatment significantly limited by social determinants of health:  obesity   Reevaluation: After the interventions noted above, I reevaluated the patient and found that they have resolved  Co morbidities that complicate the patient evaluation  Past Medical History:  Diagnosis Date   Hypertension       Dispostion: Disposition decision including need for hospitalization was considered, and patient discharged from emergency department.    Final Clinical Impression(s) / ED Diagnoses Final diagnoses:  Other fatigue  Mild dehydration  Sloan Leiter, DO 07/09/23 2212    Sloan Leiter, DO 07/09/23 2218

## 2023-07-12 ENCOUNTER — Other Ambulatory Visit (HOSPITAL_COMMUNITY): Payer: Self-pay | Admitting: Family Medicine

## 2023-07-12 DIAGNOSIS — R791 Abnormal coagulation profile: Secondary | ICD-10-CM

## 2023-07-19 ENCOUNTER — Ambulatory Visit (HOSPITAL_COMMUNITY)
Admission: RE | Admit: 2023-07-19 | Discharge: 2023-07-19 | Disposition: A | Discharge status: Home / Self Care | Payer: Medicare HMO | Source: Ambulatory Visit | Attending: Family Medicine | Admitting: Family Medicine

## 2023-07-19 ENCOUNTER — Other Ambulatory Visit (HOSPITAL_COMMUNITY): Payer: Self-pay | Admitting: Family Medicine

## 2023-07-19 DIAGNOSIS — K8020 Calculus of gallbladder without cholecystitis without obstruction: Secondary | ICD-10-CM | POA: Diagnosis not present

## 2023-07-19 DIAGNOSIS — R0602 Shortness of breath: Secondary | ICD-10-CM | POA: Diagnosis not present

## 2023-07-19 DIAGNOSIS — R791 Abnormal coagulation profile: Secondary | ICD-10-CM | POA: Diagnosis not present

## 2023-07-19 DIAGNOSIS — R918 Other nonspecific abnormal finding of lung field: Secondary | ICD-10-CM | POA: Diagnosis not present

## 2023-07-19 DIAGNOSIS — I2510 Atherosclerotic heart disease of native coronary artery without angina pectoris: Secondary | ICD-10-CM | POA: Diagnosis not present

## 2023-07-19 MED ORDER — IOHEXOL 350 MG/ML SOLN
75.0000 mL | Freq: Once | INTRAVENOUS | Status: AC | PRN
  Administered 2023-07-19: 75 mL via INTRAVENOUS

## 2023-07-28 DIAGNOSIS — I10 Essential (primary) hypertension: Secondary | ICD-10-CM | POA: Diagnosis not present

## 2023-07-28 DIAGNOSIS — I350 Nonrheumatic aortic (valve) stenosis: Secondary | ICD-10-CM | POA: Diagnosis not present

## 2023-07-28 DIAGNOSIS — R0789 Other chest pain: Secondary | ICD-10-CM | POA: Diagnosis not present

## 2023-08-04 DIAGNOSIS — I10 Essential (primary) hypertension: Secondary | ICD-10-CM | POA: Diagnosis not present

## 2023-08-04 DIAGNOSIS — I350 Nonrheumatic aortic (valve) stenosis: Secondary | ICD-10-CM | POA: Diagnosis not present

## 2023-08-04 DIAGNOSIS — R0789 Other chest pain: Secondary | ICD-10-CM | POA: Diagnosis not present

## 2023-10-14 ENCOUNTER — Other Ambulatory Visit (HOSPITAL_COMMUNITY): Payer: Self-pay | Admitting: Nurse Practitioner

## 2023-10-14 DIAGNOSIS — N433 Hydrocele, unspecified: Secondary | ICD-10-CM | POA: Diagnosis not present

## 2023-10-14 DIAGNOSIS — Z125 Encounter for screening for malignant neoplasm of prostate: Secondary | ICD-10-CM | POA: Diagnosis not present

## 2023-10-14 DIAGNOSIS — E785 Hyperlipidemia, unspecified: Secondary | ICD-10-CM | POA: Diagnosis not present

## 2023-10-14 DIAGNOSIS — R7303 Prediabetes: Secondary | ICD-10-CM | POA: Diagnosis not present

## 2023-10-14 DIAGNOSIS — D649 Anemia, unspecified: Secondary | ICD-10-CM | POA: Diagnosis not present

## 2023-10-14 DIAGNOSIS — N289 Disorder of kidney and ureter, unspecified: Secondary | ICD-10-CM | POA: Diagnosis not present

## 2023-10-14 DIAGNOSIS — R918 Other nonspecific abnormal finding of lung field: Secondary | ICD-10-CM | POA: Diagnosis not present

## 2023-10-14 DIAGNOSIS — I10 Essential (primary) hypertension: Secondary | ICD-10-CM | POA: Diagnosis not present

## 2023-10-14 DIAGNOSIS — I517 Cardiomegaly: Secondary | ICD-10-CM | POA: Diagnosis not present

## 2023-10-26 ENCOUNTER — Other Ambulatory Visit: Payer: Self-pay | Admitting: Family Medicine

## 2023-10-26 DIAGNOSIS — R918 Other nonspecific abnormal finding of lung field: Secondary | ICD-10-CM

## 2023-10-28 ENCOUNTER — Ambulatory Visit (HOSPITAL_COMMUNITY)
Admission: RE | Admit: 2023-10-28 | Discharge: 2023-10-28 | Disposition: A | Discharge status: Home / Self Care | Payer: Medicare HMO | Source: Ambulatory Visit | Attending: Nurse Practitioner | Admitting: Nurse Practitioner

## 2023-10-28 DIAGNOSIS — J984 Other disorders of lung: Secondary | ICD-10-CM | POA: Diagnosis not present

## 2023-10-28 DIAGNOSIS — R918 Other nonspecific abnormal finding of lung field: Secondary | ICD-10-CM | POA: Insufficient documentation

## 2023-10-28 MED ORDER — IOHEXOL 300 MG/ML  SOLN
80.0000 mL | Freq: Once | INTRAMUSCULAR | Status: AC | PRN
  Administered 2023-10-28: 80 mL via INTRAVENOUS

## 2023-11-10 DIAGNOSIS — D2272 Melanocytic nevi of left lower limb, including hip: Secondary | ICD-10-CM | POA: Diagnosis not present

## 2023-11-10 DIAGNOSIS — D2262 Melanocytic nevi of left upper limb, including shoulder: Secondary | ICD-10-CM | POA: Diagnosis not present

## 2023-11-10 DIAGNOSIS — D2261 Melanocytic nevi of right upper limb, including shoulder: Secondary | ICD-10-CM | POA: Diagnosis not present

## 2023-11-10 DIAGNOSIS — L570 Actinic keratosis: Secondary | ICD-10-CM | POA: Diagnosis not present

## 2023-11-10 DIAGNOSIS — D225 Melanocytic nevi of trunk: Secondary | ICD-10-CM | POA: Diagnosis not present

## 2023-11-10 DIAGNOSIS — L821 Other seborrheic keratosis: Secondary | ICD-10-CM | POA: Diagnosis not present

## 2023-11-10 DIAGNOSIS — D2271 Melanocytic nevi of right lower limb, including hip: Secondary | ICD-10-CM | POA: Diagnosis not present

## 2023-11-10 DIAGNOSIS — L7211 Pilar cyst: Secondary | ICD-10-CM | POA: Diagnosis not present

## 2023-11-19 ENCOUNTER — Encounter (HOSPITAL_COMMUNITY): Payer: Self-pay | Admitting: Nurse Practitioner

## 2023-11-19 ENCOUNTER — Other Ambulatory Visit (HOSPITAL_COMMUNITY): Payer: Self-pay | Admitting: Nurse Practitioner

## 2023-11-19 DIAGNOSIS — R0609 Other forms of dyspnea: Secondary | ICD-10-CM | POA: Diagnosis not present

## 2023-11-19 DIAGNOSIS — R079 Chest pain, unspecified: Secondary | ICD-10-CM | POA: Diagnosis not present

## 2023-11-19 DIAGNOSIS — M25469 Effusion, unspecified knee: Secondary | ICD-10-CM | POA: Diagnosis not present

## 2023-11-22 DIAGNOSIS — R0609 Other forms of dyspnea: Secondary | ICD-10-CM | POA: Diagnosis not present

## 2023-11-22 DIAGNOSIS — I350 Nonrheumatic aortic (valve) stenosis: Secondary | ICD-10-CM | POA: Diagnosis not present

## 2023-11-22 DIAGNOSIS — I10 Essential (primary) hypertension: Secondary | ICD-10-CM | POA: Diagnosis not present

## 2023-11-23 ENCOUNTER — Other Ambulatory Visit (HOSPITAL_COMMUNITY): Payer: Self-pay | Admitting: Nurse Practitioner

## 2023-11-23 DIAGNOSIS — M25469 Effusion, unspecified knee: Secondary | ICD-10-CM

## 2023-11-24 ENCOUNTER — Other Ambulatory Visit: Payer: Self-pay

## 2023-11-24 ENCOUNTER — Encounter (HOSPITAL_COMMUNITY): Payer: Self-pay

## 2023-11-24 ENCOUNTER — Emergency Department (HOSPITAL_COMMUNITY)
Admission: EM | Admit: 2023-11-24 | Discharge: 2023-11-24 | Disposition: A | Discharge status: Home / Self Care | Attending: Emergency Medicine | Admitting: Emergency Medicine

## 2023-11-24 ENCOUNTER — Ambulatory Visit
Admission: EM | Admit: 2023-11-24 | Discharge: 2023-11-24 | Disposition: A | Discharge status: Home / Self Care | Attending: Family Medicine | Admitting: Family Medicine

## 2023-11-24 ENCOUNTER — Emergency Department (HOSPITAL_COMMUNITY)

## 2023-11-24 DIAGNOSIS — R0609 Other forms of dyspnea: Secondary | ICD-10-CM | POA: Diagnosis not present

## 2023-11-24 DIAGNOSIS — R0602 Shortness of breath: Secondary | ICD-10-CM | POA: Diagnosis not present

## 2023-11-24 DIAGNOSIS — R0789 Other chest pain: Secondary | ICD-10-CM | POA: Diagnosis not present

## 2023-11-24 DIAGNOSIS — Z79899 Other long term (current) drug therapy: Secondary | ICD-10-CM | POA: Diagnosis not present

## 2023-11-24 DIAGNOSIS — I517 Cardiomegaly: Secondary | ICD-10-CM | POA: Diagnosis not present

## 2023-11-24 DIAGNOSIS — R079 Chest pain, unspecified: Secondary | ICD-10-CM | POA: Diagnosis not present

## 2023-11-24 DIAGNOSIS — I10 Essential (primary) hypertension: Secondary | ICD-10-CM | POA: Insufficient documentation

## 2023-11-24 DIAGNOSIS — R600 Localized edema: Secondary | ICD-10-CM | POA: Diagnosis not present

## 2023-11-24 DIAGNOSIS — R531 Weakness: Secondary | ICD-10-CM | POA: Diagnosis not present

## 2023-11-24 DIAGNOSIS — R918 Other nonspecific abnormal finding of lung field: Secondary | ICD-10-CM | POA: Diagnosis not present

## 2023-11-24 DIAGNOSIS — I2510 Atherosclerotic heart disease of native coronary artery without angina pectoris: Secondary | ICD-10-CM | POA: Diagnosis not present

## 2023-11-24 HISTORY — DX: Other pulmonary embolism without acute cor pulmonale: I26.99

## 2023-11-24 HISTORY — DX: Hyperlipidemia, unspecified: E78.5

## 2023-11-24 HISTORY — DX: Obesity, unspecified: E66.9

## 2023-11-24 HISTORY — DX: Acute embolism and thrombosis of unspecified deep veins of unspecified lower extremity: I82.409

## 2023-11-24 LAB — URINALYSIS, ROUTINE W REFLEX MICROSCOPIC
Bilirubin Urine: NEGATIVE
Glucose, UA: NEGATIVE mg/dL
Ketones, ur: NEGATIVE mg/dL
Leukocytes,Ua: NEGATIVE
Nitrite: NEGATIVE
Protein, ur: NEGATIVE mg/dL
Specific Gravity, Urine: 1.003 — ABNORMAL LOW (ref 1.005–1.030)
pH: 6 (ref 5.0–8.0)

## 2023-11-24 LAB — CBC WITH DIFFERENTIAL/PLATELET
Abs Immature Granulocytes: 0.11 10*3/uL — ABNORMAL HIGH (ref 0.00–0.07)
Basophils Absolute: 0 10*3/uL (ref 0.0–0.1)
Basophils Relative: 0 %
Eosinophils Absolute: 0 10*3/uL (ref 0.0–0.5)
Eosinophils Relative: 1 %
HCT: 38.5 % — ABNORMAL LOW (ref 39.0–52.0)
Hemoglobin: 12.5 g/dL — ABNORMAL LOW (ref 13.0–17.0)
Immature Granulocytes: 2 %
Lymphocytes Relative: 34 %
Lymphs Abs: 1.8 10*3/uL (ref 0.7–4.0)
MCH: 30 pg (ref 26.0–34.0)
MCHC: 32.5 g/dL (ref 30.0–36.0)
MCV: 92.3 fL (ref 80.0–100.0)
Monocytes Absolute: 0.3 10*3/uL (ref 0.1–1.0)
Monocytes Relative: 6 %
Neutro Abs: 3 10*3/uL (ref 1.7–7.7)
Neutrophils Relative %: 57 %
Platelets: 178 10*3/uL (ref 150–400)
RBC: 4.17 MIL/uL — ABNORMAL LOW (ref 4.22–5.81)
RDW: 18.6 % — ABNORMAL HIGH (ref 11.5–15.5)
WBC: 5.3 10*3/uL (ref 4.0–10.5)
nRBC: 0 % (ref 0.0–0.2)

## 2023-11-24 LAB — COMPREHENSIVE METABOLIC PANEL
ALT: 19 U/L (ref 0–44)
AST: 24 U/L (ref 15–41)
Albumin: 4 g/dL (ref 3.5–5.0)
Alkaline Phosphatase: 22 U/L — ABNORMAL LOW (ref 38–126)
Anion gap: 10 (ref 5–15)
BUN: 16 mg/dL (ref 8–23)
CO2: 26 mmol/L (ref 22–32)
Calcium: 9.2 mg/dL (ref 8.9–10.3)
Chloride: 101 mmol/L (ref 98–111)
Creatinine, Ser: 0.98 mg/dL (ref 0.61–1.24)
GFR, Estimated: >60 mL/min (ref >60)
Glucose, Bld: 99 mg/dL (ref 70–99)
Potassium: 4 mmol/L (ref 3.5–5.1)
Sodium: 137 mmol/L (ref 135–145)
Total Bilirubin: 0.6 mg/dL (ref 0.0–1.2)
Total Protein: 7.5 g/dL (ref 6.5–8.1)

## 2023-11-24 LAB — RESP PANEL BY RT-PCR (RSV, FLU A&B, COVID)  RVPGX2
Influenza A by PCR: NEGATIVE
Influenza B by PCR: NEGATIVE
Resp Syncytial Virus by PCR: NEGATIVE
SARS Coronavirus 2 by RT PCR: NEGATIVE

## 2023-11-24 LAB — TROPONIN I (HIGH SENSITIVITY)
Troponin I (High Sensitivity): 8 ng/L (ref <18)
Troponin I (High Sensitivity): 8 ng/L (ref <18)

## 2023-11-24 LAB — MAGNESIUM: Magnesium: 2.2 mg/dL (ref 1.7–2.4)

## 2023-11-24 MED ORDER — IOHEXOL 350 MG/ML SOLN
75.0000 mL | Freq: Once | INTRAVENOUS | Status: AC | PRN
  Administered 2023-11-24: 75 mL via INTRAVENOUS

## 2023-11-24 NOTE — Discharge Instructions (Signed)
 Thank you for coming to [facility] Emergency Department. You were seen for feeling unwell, dizzy, fatigued, with intermittent chest pain. We did an exam, labs, and imaging, and these showed no acute findings. Your symptoms could be due to your aortic stenosis. Please follow up with your cardiologist as recommended for repeat echocardiogram and other workup. Please also follow up with your primary care physician for routine follow up for a nodule in your lung.    Do not hesitate to return to the ED or call 911 if you experience: -Worsening symptoms -Severe chest pain -Shortness of breath -Lightheadedness, passing out -Fevers/chills -Anything else that concerns you

## 2023-11-24 NOTE — ED Provider Notes (Signed)
 [place] EMERGENCY DEPARTMENT AT [facility] Provider Note   CSN: [id number] Arrival date & time: 11/24/23  1510     History {Add pertinent medical, surgical, social history, OB history to HPI:1} Chief Complaint  Patient presents with   Chest Pain    Tommy Barton is a 89 y.o. male with PMH as listed below who arrived via POV from urgent care for further evaluation. Pt reports being told he had an abnormal EKG. Pt reports fatigue and weakness for the past couple of days. States that over the last week or so he just hasn't felt right. Also endorses some dyspnea on exertion.  Denies any fever/chills or cough, other flulike symptoms.  Pt reports left chest pain with radiation to his back, but he states that has been present for several years.  Not worse with exertion.  No associated nausea vomiting or orthopnea.  Does report fluctuating bilateral lower extremity peripheral edema.   Last echo in 2024 LVEF >55% with normal right ventricular function and mild TR and moderate aortic stenosis.  Follows with [facility] cardiology, last appointment 2 days ago where he was seen for worsening shortness of breath and dizziness.  Symptoms were reported to fluctuate throughout the day.  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  Does take atenolol and at the appointment he was instructed to wean off of it over the next 4 to 5 days to see if it would help his heart rate.  Recommendation was to repeat another echocardiogram and consider referral to [facility] for TAVR team evaluation if stenosis had worsened.   Past Medical History:  Diagnosis Date   DVT (deep venous thrombosis) (HCC)    Hyperlipidemia    Hypertension    Obesity    PE (pulmonary thromboembolism) (HCC)        Home Medications Prior to Admission medications   Medication Sig Start Date End Date Taking? Authorizing Provider  amLODipine (NORVASC) 5 MG tablet Take 1 tablet by mouth  once daily 06/15/22   Corky Downs, MD  atenolol (TENORMIN) 50 MG tablet Take 1 tablet by mouth once daily 08/26/21   Corky Downs, MD  diclofenac (VOLTAREN) 75 MG EC tablet Take 1 tablet (75 mg total) by mouth 2 (two) times daily as needed. 12/26/16   Burgess Amor, PA-C  doxycycline (DORYX) 100 MG EC tablet Take 100 mg by mouth 2 (two) times daily. Take x 10 days, per patient started med a week ago    [provider]  enalapril (VASOTEC) 20 MG tablet Take 2 tablets by mouth once daily 08/18/22   Corky Downs, MD  hydrochlorothiazide (HYDRODIURIL) 25 MG tablet Take 1 tablet by mouth daily. 12/13/16   [provider]  lovastatin (MEVACOR) 40 MG tablet Take 1 tablet by mouth daily. 12/13/16   [provider]      Allergies    Patient has no known allergies.    Review of Systems   Review of Systems A 10 point review of systems was performed and is negative unless otherwise reported in HPI.  Physical Exam Updated Vital Signs BP (!) 178/78 (BP Location: Right Arm)   Pulse (!) 54   Temp 97.8 F (36.6 C) (Temporal)   Resp 18   Ht 5\' 9"  (1.753 m)   Wt 120.7 kg   SpO2 99%   BMI 39.30 kg/m  Physical Exam General: Normal appearing obese elderly male, sitting in a chair.  HEENT: PERRLA,  Sclera anicteric, MMM, trachea midline.  Cardiology: RRR, 3/6 holosystolic murmur. Resp: Normal respiratory rate and effort. CTAB, no wheezes, rhonchi, crackles.  Abd: Soft, non-tender, non-distended. No rebound tenderness or guarding.  GU: Deferred. MSK: No peripheral edema or signs of trauma. Extremities without deformity or TTP. No cyanosis or clubbing. Skin: warm, dry.  Neuro: A&Ox4, CNs II-XII grossly intact. MAEs. Sensation grossly intact.  Psych: Normal mood and affect.   ED Results / Procedures / Treatments   Labs (all labs ordered are listed, but only abnormal results are displayed) Labs Reviewed  RESP PANEL BY RT-PCR (RSV, FLU A&B, COVID)  RVPGX2  CBC WITH  DIFFERENTIAL/PLATELET  COMPREHENSIVE METABOLIC PANEL  MAGNESIUM  URINALYSIS, ROUTINE W REFLEX MICROSCOPIC  TROPONIN I (HIGH SENSITIVITY)    EKG EKG Interpretation Date/Time:  Wednesday November 24 2023 15:26:35 EDT Ventricular Rate:  53 PR Interval:  192 QRS Duration:  92 QT Interval:  458 QTC Calculation: 429 R Axis:   -6  Text Interpretation: Sinus bradycardia Otherwise normal ECG Confirmed by Vivi Barrack [phone]) on 11/24/2023 4:25:17 PM  Radiology No results found.  Procedures Procedures  {Document cardiac monitor, telemetry assessment procedure when appropriate:1}  Medications Ordered in ED Medications - No data to display  ED Course/ Medical Decision Making/ A&P                          Medical Decision Making Amount and/or Complexity of Data Reviewed Labs: ordered. Radiology: ordered.    This patient presents to the ED for concern of fatigue/dizziness/DOE, intermittent chronic CP, this involves an extensive number of treatment options, and is a complaint that carries with it a high risk of complications and morbidity.  I considered the following differential and admission for this acute, potentially life threatening condition.   MDM:    DDX for DOE, fatigue, dizziness, and chest pain includes but is not limited to:  ACS/arrhythmia,  PE, aortic dissection, PNA, PTX, esophogeal rupture, biliary disease, cardiac tamponade, pericarditis, GERD/PUD/gastritis, or musculoskeletal pain. Very low suspicion for ACS vs aortic dissection given presenting sx. Patient cannot PERC out based on tachycardia, but will obtain D dimer and reassess, minimal risk factors for PE.  *** Cardiac- CHF, Myocardial Ischemia, aortic stenosis, bradycardia  No cough, f/c to suggest PNA. No crackles/LEE to suggest CHF or pulm edema. No wheezing or h/o COPD.        Labs: I Ordered, and personally interpreted labs.  The pertinent results include:  those listed above  Imaging Studies  ordered: I ordered imaging studies including CXR,C T PE I independently visualized and interpreted imaging. I agree with the radiologist interpretation  Additional history obtained from chart review, son at bedside.  External records from outside source obtained and reviewed including [facility]  Reevaluation: After the interventions noted above, I reevaluated the patient and found that they have :stayed the same  Social Determinants of Health: Lives independently  Disposition:  ***  Co morbidities that complicate the patient evaluation  Past Medical History:  Diagnosis Date   DVT (deep venous thrombosis) (HCC)    Hyperlipidemia    Hypertension    Obesity    PE (pulmonary thromboembolism) (HCC)      Medicines No orders of the defined types were placed in this encounter.   I have reviewed the patients home medicines and have made adjustments as needed  Problem List / ED Course: Problem List Items Addressed This Visit   None        {  Document critical care time when appropriate:1} {Document review of labs and clinical decision tools ie heart score, Chads2Vasc2 etc:1}  {Document your independent review of radiology images, and any outside records:1} {Document your discussion with family members, caretakers, and with consultants:1} {Document social determinants of health affecting pt's care:1} {Document your decision making why or why not admission, treatments were needed:1}  This note was created using dictation software, which may contain spelling or grammatical errors.

## 2023-11-24 NOTE — ED Triage Notes (Signed)
 Pt arrived via POV from urgent care for further evaluation. Pt reports being told he had an abnormal EKG. Pt reports fatigue and weakness for the past couple of days. Pt reports left chest pain with radiation to his back.

## 2023-11-24 NOTE — ED Triage Notes (Signed)
 Pt reports feeling bad all last week was seen at Dr. Scharlene Gloss Friday had an EKG, seen at [facility] on Monday for the EKG in Cardiology, felt bad yesterday and today started feeling worse. Was unable to drive here had sun bring him.

## 2023-11-25 DIAGNOSIS — I350 Nonrheumatic aortic (valve) stenosis: Secondary | ICD-10-CM | POA: Diagnosis not present

## 2023-12-07 DIAGNOSIS — I350 Nonrheumatic aortic (valve) stenosis: Secondary | ICD-10-CM | POA: Diagnosis not present

## 2023-12-07 DIAGNOSIS — R0609 Other forms of dyspnea: Secondary | ICD-10-CM | POA: Diagnosis not present

## 2023-12-07 DIAGNOSIS — I10 Essential (primary) hypertension: Secondary | ICD-10-CM | POA: Diagnosis not present

## 2023-12-07 DIAGNOSIS — R001 Bradycardia, unspecified: Secondary | ICD-10-CM | POA: Diagnosis not present

## 2023-12-14 DIAGNOSIS — M25469 Effusion, unspecified knee: Secondary | ICD-10-CM | POA: Diagnosis not present

## 2023-12-14 DIAGNOSIS — M7989 Other specified soft tissue disorders: Secondary | ICD-10-CM | POA: Diagnosis not present

## 2023-12-14 DIAGNOSIS — R0609 Other forms of dyspnea: Secondary | ICD-10-CM | POA: Diagnosis not present

## 2023-12-14 DIAGNOSIS — R079 Chest pain, unspecified: Secondary | ICD-10-CM | POA: Diagnosis not present

## 2024-01-04 ENCOUNTER — Other Ambulatory Visit: Payer: Self-pay | Admitting: Nurse Practitioner

## 2024-01-04 ENCOUNTER — Other Ambulatory Visit (HOSPITAL_COMMUNITY): Payer: Self-pay | Admitting: Nurse Practitioner

## 2024-01-04 DIAGNOSIS — M25469 Effusion, unspecified knee: Secondary | ICD-10-CM

## 2024-01-05 ENCOUNTER — Ambulatory Visit (HOSPITAL_COMMUNITY)
Admission: RE | Admit: 2024-01-05 | Discharge: 2024-01-05 | Disposition: A | Discharge status: Home / Self Care | Source: Ambulatory Visit | Attending: Nurse Practitioner | Admitting: Nurse Practitioner

## 2024-01-05 DIAGNOSIS — M25469 Effusion, unspecified knee: Secondary | ICD-10-CM | POA: Diagnosis not present

## 2024-01-05 DIAGNOSIS — M79605 Pain in left leg: Secondary | ICD-10-CM | POA: Diagnosis not present

## 2024-01-21 DIAGNOSIS — L820 Inflamed seborrheic keratosis: Secondary | ICD-10-CM | POA: Diagnosis not present

## 2024-01-21 DIAGNOSIS — R202 Paresthesia of skin: Secondary | ICD-10-CM | POA: Diagnosis not present

## 2024-01-21 DIAGNOSIS — L538 Other specified erythematous conditions: Secondary | ICD-10-CM | POA: Diagnosis not present

## 2024-01-21 DIAGNOSIS — L2989 Other pruritus: Secondary | ICD-10-CM | POA: Diagnosis not present

## 2024-01-28 DIAGNOSIS — M791 Myalgia, unspecified site: Secondary | ICD-10-CM | POA: Diagnosis not present

## 2024-01-28 DIAGNOSIS — R208 Other disturbances of skin sensation: Secondary | ICD-10-CM | POA: Diagnosis not present

## 2024-03-16 DIAGNOSIS — I10 Essential (primary) hypertension: Secondary | ICD-10-CM | POA: Diagnosis not present

## 2024-03-16 DIAGNOSIS — I350 Nonrheumatic aortic (valve) stenosis: Secondary | ICD-10-CM | POA: Diagnosis not present

## 2024-03-16 DIAGNOSIS — Z6839 Body mass index (BMI) 39.0-39.9, adult: Secondary | ICD-10-CM | POA: Diagnosis not present

## 2024-03-16 DIAGNOSIS — R0789 Other chest pain: Secondary | ICD-10-CM | POA: Diagnosis not present

## 2024-03-16 DIAGNOSIS — E66812 Obesity, class 2: Secondary | ICD-10-CM | POA: Diagnosis not present

## 2024-04-12 DIAGNOSIS — I517 Cardiomegaly: Secondary | ICD-10-CM | POA: Diagnosis not present

## 2024-04-12 DIAGNOSIS — Z0001 Encounter for general adult medical examination with abnormal findings: Secondary | ICD-10-CM | POA: Diagnosis not present

## 2024-04-12 DIAGNOSIS — I10 Essential (primary) hypertension: Secondary | ICD-10-CM | POA: Diagnosis not present

## 2024-04-12 DIAGNOSIS — R7303 Prediabetes: Secondary | ICD-10-CM | POA: Diagnosis not present

## 2024-04-12 DIAGNOSIS — E785 Hyperlipidemia, unspecified: Secondary | ICD-10-CM | POA: Diagnosis not present

## 2024-04-12 DIAGNOSIS — R918 Other nonspecific abnormal finding of lung field: Secondary | ICD-10-CM | POA: Diagnosis not present

## 2024-04-12 DIAGNOSIS — N433 Hydrocele, unspecified: Secondary | ICD-10-CM | POA: Diagnosis not present

## 2024-04-12 DIAGNOSIS — D649 Anemia, unspecified: Secondary | ICD-10-CM | POA: Diagnosis not present

## 2024-04-12 DIAGNOSIS — N289 Disorder of kidney and ureter, unspecified: Secondary | ICD-10-CM | POA: Diagnosis not present

## 2024-06-16 DIAGNOSIS — E66812 Obesity, class 2: Secondary | ICD-10-CM | POA: Diagnosis not present

## 2024-06-16 DIAGNOSIS — Z6839 Body mass index (BMI) 39.0-39.9, adult: Secondary | ICD-10-CM | POA: Diagnosis not present

## 2024-06-16 DIAGNOSIS — I350 Nonrheumatic aortic (valve) stenosis: Secondary | ICD-10-CM | POA: Diagnosis not present

## 2024-06-16 DIAGNOSIS — I10 Essential (primary) hypertension: Secondary | ICD-10-CM | POA: Diagnosis not present

## 2024-06-16 DIAGNOSIS — R0789 Other chest pain: Secondary | ICD-10-CM | POA: Diagnosis not present

## 2024-07-06 DIAGNOSIS — Z6839 Body mass index (BMI) 39.0-39.9, adult: Secondary | ICD-10-CM | POA: Diagnosis not present

## 2024-07-06 DIAGNOSIS — E66812 Obesity, class 2: Secondary | ICD-10-CM | POA: Diagnosis not present

## 2024-07-06 DIAGNOSIS — I350 Nonrheumatic aortic (valve) stenosis: Secondary | ICD-10-CM | POA: Diagnosis not present

## 2024-07-06 DIAGNOSIS — E871 Hypo-osmolality and hyponatremia: Secondary | ICD-10-CM | POA: Diagnosis not present

## 2024-07-06 DIAGNOSIS — I10 Essential (primary) hypertension: Secondary | ICD-10-CM | POA: Diagnosis not present
